# Patient Record
Sex: Female | Born: 1990 | Race: Black or African American | Hispanic: No | Marital: Single | Smoking: Never smoker
Health system: Southern US, Community
[De-identification: ages and names within clinical notes are randomized; demographics above are authoritative.]

## PROBLEM LIST (undated history)

## (undated) ENCOUNTER — Other Ambulatory Visit

## (undated) ENCOUNTER — Encounter

## (undated) ENCOUNTER — Telehealth

---

## 2016-04-15 DIAGNOSIS — N898 Other specified noninflammatory disorders of vagina: Secondary | ICD-10-CM

## 2016-04-15 DIAGNOSIS — R112 Nausea with vomiting, unspecified: Secondary | ICD-10-CM

## 2016-04-15 DIAGNOSIS — Z7721 Contact with and (suspected) exposure to potentially hazardous body fluids: Secondary | ICD-10-CM

## 2016-04-15 DIAGNOSIS — M549 Dorsalgia, unspecified: Secondary | ICD-10-CM

## 2016-04-15 DIAGNOSIS — R109 Unspecified abdominal pain: Principal | ICD-10-CM

## 2016-04-15 DIAGNOSIS — O98219 Gonorrhea complicating pregnancy, unspecified trimester: Secondary | ICD-10-CM

## 2016-04-15 DIAGNOSIS — Z206 Contact with and (suspected) exposure to human immunodeficiency virus [HIV]: Secondary | ICD-10-CM

## 2016-04-15 DIAGNOSIS — R197 Diarrhea, unspecified: Secondary | ICD-10-CM

## 2016-04-15 DIAGNOSIS — J45909 Unspecified asthma, uncomplicated: Principal | ICD-10-CM

## 2016-04-15 DIAGNOSIS — R102 Pelvic and perineal pain: Secondary | ICD-10-CM

## 2016-04-15 MED ORDER — AZITHROMYCIN 250 MG PO TABS
1000 mg | Freq: Once | ORAL | Status: CP
Start: 2016-04-15 — End: ?

## 2016-04-15 MED ORDER — CEFTRIAXONE SODIUM 250 MG IJ SOLR
250 mg | Freq: Once | INTRAMUSCULAR | Status: CP
Start: 2016-04-15 — End: ?

## 2016-04-15 MED ORDER — DOXYCYCLINE HYCLATE 100 MG PO CAPS
100 mg | Freq: Two times a day (BID) | ORAL | 0 refills | Status: CP
Start: 2016-04-15 — End: 2017-03-11

## 2016-04-16 ENCOUNTER — Emergency Department: Admit: 2016-04-16 | Discharge: 2016-04-16

## 2016-04-16 ENCOUNTER — Inpatient Hospital Stay: Admit: 2016-04-16 | Discharge: 2016-04-16

## 2016-04-16 NOTE — ED Notes
Time of discharge: 0053  AM., Patient discharged to  Home.  Patient discharged  ambulatory. to exit with belongings in  Stable condition.  Patient escorted by  no one., Written discharge instructions given to  patient.  Patient/recipient  verbalizes discharge instructions.

## 2016-04-16 NOTE — ED Provider Notes
ED Course   Discussed results and treatment for possible STD/PID. Follow up with PCP and GYN return if any new or worse symptoms.       ED Disposition   ED Disposition: Discharge      ED Clinical Impression   ED Clinical Impression:   Vaginal discharge  Pelvic pain      ED Patient Status   Patient Status:   Good        ED Medical Evaluation Initiated   Medical Evaluation Initiated:   Yes, filed at 04/15/16 2014  by Stefano GaulNicoloso, Francesca, PA-C

## 2016-04-16 NOTE — ED Provider Notes
Constitutional: She is oriented to person, place, and time. She appears well-developed and well-nourished. No distress.   HENT:   Head: Normocephalic and atraumatic.   Nose: Nose normal.   Mouth/Throat: Oropharynx is clear and moist.   Eyes: Conjunctivae are normal. Pupils are equal, round, and reactive to light.   Neck: Neck supple. No tracheal deviation present.   Cardiovascular: Normal rate and regular rhythm.    Pulmonary/Chest: Effort normal and breath sounds normal. No stridor. No respiratory distress. She has no wheezes.   Abdominal: Soft. Normal appearance. There is tenderness in the suprapubic area. There is no rebound and no guarding.   Genitourinary: There is no rash or tenderness on the right labia. There is no rash or tenderness on the left labia. Cervix exhibits motion tenderness and discharge. Right adnexum displays tenderness. Right adnexum displays no mass. Left adnexum displays no mass and no tenderness. No erythema, tenderness or bleeding in the vagina. No foreign body in the vagina. Vaginal discharge found.   Genitourinary Comments: Thick white discharge with foul odor   Musculoskeletal: She exhibits no edema.   Neurological: She is alert and oriented to person, place, and time. She exhibits normal muscle tone. Coordination normal.   Skin: Skin is warm and dry. No rash noted. She is not diaphoretic.   Psychiatric: She has a normal mood and affect. Her behavior is normal. Judgment and thought content normal.   Nursing note and vitals reviewed.      Differential DDx: STD, UTI, pregnancy, other    Is this an Emergent Medical Condition? Yes - Severe Pain/Acute Onset of Symptons  409.901 FS  641.19 FS  627.732 (16) FS    ED Workup   Procedures    Labs:  - - No data to display      Imaging (Read by ED Provider):  {Imaging [phone]}    EKG (Read by ED Provider):  not applicable    MDM    ED Course & Re-Evaluation     ED Course         ED Disposition   ED Disposition: No ED Disposition Set

## 2016-04-16 NOTE — ED Provider Notes
?   ECTOPIC PREGNANCY (ABDOMINAL) N/A 10/11/2013    ECTOPIC PREGNANCY (ABDOMINAL) performed by Valinda HoarBurnett, Erin Hale, MD at [facility]   ? LAPAROSCOPY ECTOPIC PREGNANCY N/A 10/11/2013    LAPAROSCOPY ECTOPIC PREGNANCY performed by Valinda HoarBurnett, Erin Hale, MD at [facility]       Family History   Problem Relation Age of Onset   ? Diabetes Other    ? High Blood Pressure Other    ? Diabetes Maternal Grandmother        Social History     Social History   ? Marital status: Single     Spouse name: N/A   ? Number of children: N/A   ? Years of education: N/A     Social History Main Topics   ? Smoking status: Never Smoker   ? Smokeless tobacco: Never Used   ? Alcohol use Yes      Comment: social   ? Drug use: No   ? Sexual activity: Yes     Partners: Male     Other Topics Concern   ? None     Social History Narrative       Review of Systems   Constitutional: Negative for fever, chills and fatigue.   Respiratory: Negative for chest tightness, shortness of breath and wheezing.    Gastrointestinal: Positive for nausea and abdominal pain. Negative for vomiting and blood in stool.   Genitourinary: Positive for vaginal discharge and pelvic pain. Negative for bladder incontinence, dysuria, hesitancy, hematuria, vaginal bleeding and dyspareunia.   Musculoskeletal: Positive for back pain.   Skin: Negative for color change, pallor, rash and wound.   Neurological: Negative for weakness.   All other systems reviewed and are negative.      Physical Exam     ED Triage Vitals   BP 04/15/16 2007 108/79   Pulse 04/15/16 2007 106   Resp 04/15/16 2007 18   Temp 04/15/16 2007 36.8 ?C (98.3 ?F)   Temp src 04/15/16 2007 Oral   Height 04/15/16 2007 1.615 m   Weight 04/15/16 2007 95.3 kg   SpO2 04/15/16 2007 99 %   BMI (Calculated) 04/15/16 2007 36.58       BP 108/79 - Pulse 106 - Temp 36.8 ?C (98.3 ?F) (Oral)  - Resp 18 - Ht 1.615 m - Wt 95.3 kg - SpO2 99% - BMI 36.5 kg/m2    Physical Exam

## 2016-04-16 NOTE — ED Provider Notes
Constitutional: She is oriented to person, place, and time. She appears well-developed and well-nourished. No distress.   HENT:   Head: Normocephalic and atraumatic.   Nose: Nose normal.   Mouth/Throat: Oropharynx is clear and moist.   Eyes: Conjunctivae are normal. Pupils are equal, round, and reactive to light.   Neck: Neck supple. No tracheal deviation present.   Cardiovascular: Normal rate and regular rhythm.    Pulmonary/Chest: Effort normal and breath sounds normal. No stridor. No respiratory distress. She has no wheezes.   Abdominal: Soft. Normal appearance. There is tenderness in the suprapubic area. There is no rebound and no guarding.   Musculoskeletal: She exhibits no edema.   Neurological: She is alert and oriented to person, place, and time. She exhibits normal muscle tone. Coordination normal.   Skin: Skin is warm and dry. No rash noted. She is not diaphoretic.   Psychiatric: She has a normal mood and affect. Her behavior is normal. Judgment and thought content normal.   Nursing note and vitals reviewed.      Differential DDx: STD, UTI, pregnancy, other    Is this an Emergent Medical Condition? Yes - Severe Pain/Acute Onset of Symptons  409.901 FS  641.19 FS  627.732 (16) FS    ED Workup   Procedures    Labs:  - - No data to display      Imaging (Read by ED Provider):  {Imaging findings:[phone]}    EKG (Read by ED Provider):  not applicable    MDM    ED Course & Re-Evaluation     ED Course         ED Disposition   ED Disposition: No ED Disposition Set      ED Clinical Impression   ED Clinical Impression:   No Clinical Impression Set      ED Patient Status   Patient Status:   Good        ED Medical Evaluation Initiated   Medical Evaluation Initiated:   Yes, filed at 04/15/16 2014  by Stefano GaulNicoloso, Francesca, PA-C

## 2016-04-16 NOTE — ED Provider Notes
History     Chief Complaint   Patient presents with   ? Vaginal Discharge   ? Back Pain   ? Diarrhea   ? Emesis   ? Abdominal Pain       Patient is a 25 y.o. female presenting with vaginal discharge. The history is provided by the patient. No language interpreter was used.   Vaginal Discharge   Quality:  White  Severity:  Moderate  Onset quality:  Gradual  Duration:  3 days  Timing:  Constant  Progression:  Worsening  Chronicity:  New  Relieved by:  Nothing  Associated symptoms: abdominal pain and nausea    Associated symptoms: no dyspareunia, no dysuria, no fever, no urinary frequency, no urinary hesitancy, no urinary incontinence, no vaginal itching and no vomiting    Risk factors: unprotected sex        Allergies   Allergen Reactions   ? No Known Allergies      No Known Allergies       Patient's Medications   New Prescriptions    DOXYCYCLINE (VIBRAMYCIN) 100 MG CAPSULE    Take 1 capsule by mouth 2 times daily for 10 days.   Previous Medications    ALBUTEROL (PROAIR HFA;VENTOLIN HFA) 108 (90 BASE) MCG/ACT AEROSOL SOLUTION    Inhale 2 puffs every 6 hours as needed for wheezing (Dispense with spacer and teach how to use).    DICYCLOMINE (BENTYL) 20 MG TABLET    Take 1 tablet by mouth 3 times daily as needed.    HYDROCODONE-ACETAMINOPHEN (NORCO) 5-325 MG TABLET    Take 1 tablet by mouth every 6 hours as needed for pain.    IBUPROFEN (ADVIL,MOTRIN) 800 MG TABLET    Take 1 tablet by mouth every 6 hours as needed for pain.    ONDANSETRON (ZOFRAN-ODT) 4 MG TABLET DISPERSIBLE    Dissolve 1 tablet in mouth every 8 hours as needed for nausea or vomiting.   Modified Medications    No medications on file   Discontinued Medications    No medications on file       Past Medical History:   Diagnosis Date   ? Asthma    ? Chlamydia infection complicating pregnancy     03/20/11   ? Gonorrhea complicating pregnancy     03/20/11   ? HIV exposure     per pt mother and father deceased from AIDS       Past Surgical History:

## 2016-04-16 NOTE — ED Provider Notes
History     Chief Complaint   Patient presents with   ? Vaginal Discharge   ? Back Pain   ? Diarrhea   ? Emesis   ? Abdominal Pain       Patient is a 25 y.o. female presenting with vaginal discharge. The history is provided by the patient. No language interpreter was used.   Vaginal Discharge   Quality:  White  Severity:  Moderate  Onset quality:  Gradual  Duration:  3 days  Timing:  Constant  Progression:  Worsening  Chronicity:  New  Relieved by:  Nothing  Associated symptoms: abdominal pain and nausea    Associated symptoms: no dyspareunia, no dysuria, no fever, no urinary frequency, no urinary hesitancy, no urinary incontinence, no vaginal itching and no vomiting    Risk factors: unprotected sex        Allergies   Allergen Reactions   ? No Known Allergies      No Known Allergies       Patient's Medications   New Prescriptions    No medications on file   Previous Medications    ALBUTEROL (PROAIR HFA;VENTOLIN HFA) 108 (90 BASE) MCG/ACT AEROSOL SOLUTION    Inhale 2 puffs every 6 hours as needed for wheezing (Dispense with spacer and teach how to use).    DICYCLOMINE (BENTYL) 20 MG TABLET    Take 1 tablet by mouth 3 times daily as needed.    HYDROCODONE-ACETAMINOPHEN (NORCO) 5-325 MG TABLET    Take 1 tablet by mouth every 6 hours as needed for pain.    IBUPROFEN (ADVIL,MOTRIN) 800 MG TABLET    Take 1 tablet by mouth every 6 hours as needed for pain.    ONDANSETRON (ZOFRAN-ODT) 4 MG TABLET DISPERSIBLE    Dissolve 1 tablet in mouth every 8 hours as needed for nausea or vomiting.   Modified Medications    No medications on file   Discontinued Medications    No medications on file       Past Medical History:   Diagnosis Date   ? Asthma    ? Chlamydia infection complicating pregnancy     03/20/11   ? Gonorrhea complicating pregnancy     03/20/11   ? HIV exposure     per pt mother and father deceased from AIDS       Past Surgical History:   Procedure Laterality Date   ? CESAREAN SECTION

## 2016-04-16 NOTE — ED Provider Notes
Procedure Laterality Date   ? CESAREAN SECTION     ? ECTOPIC PREGNANCY (ABDOMINAL) N/A 10/11/2013    ECTOPIC PREGNANCY (ABDOMINAL) performed by Valinda HoarBurnett, Erin Hale, MD at [facility]   ? LAPAROSCOPY ECTOPIC PREGNANCY N/A 10/11/2013    LAPAROSCOPY ECTOPIC PREGNANCY performed by Valinda HoarBurnett, Erin Hale, MD at [facility]       Family History   Problem Relation Age of Onset   ? Diabetes Other    ? High Blood Pressure Other    ? Diabetes Maternal Grandmother        Social History     Social History   ? Marital status: Single     Spouse name: N/A   ? Number of children: N/A   ? Years of education: N/A     Social History Main Topics   ? Smoking status: Never Smoker   ? Smokeless tobacco: Never Used   ? Alcohol use Yes      Comment: social   ? Drug use: No   ? Sexual activity: Yes     Partners: Male     Other Topics Concern   ? None     Social History Narrative       Review of Systems   Constitutional: Negative for fever, chills and fatigue.   Respiratory: Negative for chest tightness, shortness of breath and wheezing.    Gastrointestinal: Positive for nausea and abdominal pain. Negative for vomiting and blood in stool.   Genitourinary: Positive for vaginal discharge and pelvic pain. Negative for bladder incontinence, dysuria, hesitancy, hematuria, vaginal bleeding and dyspareunia.   Musculoskeletal: Positive for back pain.   Skin: Negative for color change, pallor, rash and wound.   Neurological: Negative for weakness.   All other systems reviewed and are negative.      Physical Exam       ED Triage Vitals   BP 04/15/16 2007 108/79   Pulse 04/15/16 2007 106   Resp 04/15/16 2007 18   Temp 04/15/16 2007 36.8 ?C (98.3 ?F)   Temp src 04/15/16 2007 Oral   Height 04/15/16 2007 1.615 m   Weight 04/15/16 2007 95.3 kg   SpO2 04/15/16 2007 99 %   BMI (Calculated) 04/15/16 2007 36.58       BP 108/79 - Pulse 106 - Temp 36.8 ?C (98.3 ?F) (Oral)  - Resp 18 - Ht 1.615 m - Wt 95.3 kg - SpO2 99% - BMI 36.5 kg/m2    Physical Exam

## 2016-04-16 NOTE — ED Notes
Pt ambulates to triage room 2 without complications . Pt is aaox4. Pts c/c " abd pain with N/V/D and vaginal discharge ". Pt is eating chips during triage . Pts states this has been going on since last night . Pt denies any dysuria . Pt denies any complications with bowel movements or blood in urine or stool. Pt denies any injuries. Pt denies any new medications or food . Pts resp are even  And non labored. Pts lung are clear bil. Pt has rounded abd with+ bowel sounds noted. Pt has no tenderness noted. Pts VSS. Pt to [facility] until bed available . Pt denies any vaginal bleeding or odor . Pt reports vaginal discharge of white color .

## 2016-04-16 NOTE — ED Provider Notes
ED Clinical Impression   ED Clinical Impression:   No Clinical Impression Set      ED Patient Status   Patient Status:   Good        ED Medical Evaluation Initiated   Medical Evaluation Initiated:   Yes, filed at 04/15/16 2014  by Stefano GaulNicoloso, Francesca, PA-C

## 2016-04-16 NOTE — ED Provider Notes
ED Course   Discussed results and treatment (given today) for possible STD/PID. Follow up with PCP and GYN return if any new or worse symptoms.       ED Disposition   ED Disposition: Discharge      ED Clinical Impression   ED Clinical Impression:   Vaginal discharge  Pelvic pain      ED Patient Status   Patient Status:   Good        ED Medical Evaluation Initiated   Medical Evaluation Initiated:   Yes, filed at 04/15/16 2014  by Stefano GaulNicoloso, Francesca, PA-C          The patient was seen exclusively by the PA/ARNP, and was not seen by me. I was immediately available in the Emergency Department for consultation as needed.         Rogelia RohrerStorch, Ian David, MD  04/16/16 [id number]

## 2016-04-16 NOTE — ED Provider Notes
Constitutional: She is oriented to person, place, and time. She appears well-developed and well-nourished. No distress.   HENT:   Head: Normocephalic and atraumatic.   Nose: Nose normal.   Mouth/Throat: Oropharynx is clear and moist.   Eyes: Conjunctivae are normal. Pupils are equal, round, and reactive to light.   Neck: Neck supple. No tracheal deviation present.   Cardiovascular: Normal rate and regular rhythm.    Pulmonary/Chest: Effort normal and breath sounds normal. No stridor. No respiratory distress. She has no wheezes.   Abdominal: Soft. Normal appearance. There is tenderness in the suprapubic area. There is no rebound and no guarding.   Genitourinary: There is no rash or tenderness on the right labia. There is no rash or tenderness on the left labia. Cervix exhibits motion tenderness and discharge. Right adnexum displays tenderness. Right adnexum displays no mass. Left adnexum displays no mass and no tenderness. No erythema, tenderness or bleeding in the vagina. No foreign body in the vagina. Vaginal discharge found.   Genitourinary Comments: Thick white discharge with foul odor   Musculoskeletal: She exhibits no edema.   Neurological: She is alert and oriented to person, place, and time. She exhibits normal muscle tone. Coordination normal.   Skin: Skin is warm and dry. No rash noted. She is not diaphoretic.   Psychiatric: She has a normal mood and affect. Her behavior is normal. Judgment and thought content normal.   Nursing note and vitals reviewed.      Differential DDx: STD, UTI, pregnancy, other    Is this an Emergent Medical Condition? Yes - Severe Pain/Acute Onset of Symptons  409.901 FS  641.19 FS  627.732 (16) FS    ED Workup   Procedures    Labs:  -   URINALYSIS W/MICROSCOPY - Abnormal        Result Value Ref Range    Specific Gravity, Urine 1.031 (*) 1.003 - 1.030    Ketones UA 20.0 (*) Negative mg/dL    Bacteria -Ur Rare (*) None seen /HPF    Color -Ur Yellow  Amber    Clarity, UA Hazy  Hazy

## 2016-04-16 NOTE — ED Provider Notes
pH, Urine 5.0  4.5 - 8.0    Protein-UA Negative  Negative mg/dL    Glucose -Ur Negative  Negative mg/dL    Bilirubin -Ur Negative  Negative    Blood -Ur Negative  Negative    Nitrite -Ur Negative  Negative    Urobilinogen -Ur Normal  Normal    Leukocytes -Ur Negative  Negative    RBC -Ur 1  0 - 5 /HPF    WBC -Ur 1  0 - 5 /HPF    Squam Epithel, UA 3  Not established /HPF    Mucus -Ur Rare  2+ /LPF    ASCORBIC ACID 40.0  20 mg/dL   WET PREP - Normal    Trichomonas Vaginalis Negative for Trichomonas   Negative for Trichomonas     YEAST Negative for Yeast  Negative for Yeast   HCG, QUALITATIVE, URINE - Normal    HCG-Urine Negative  Negative   CHLAMYDIA/ GONORRHEA DETECTIO*         Imaging (Read by ED Provider):  Per Radiology: [place] Pelvic Transabdominal & Transvaginal W Doppler    Result Date: 04/15/2016  Procedure:  US PELVIC TRANSABDOMINAL & TRANSVAGINAL W DOPPLER Ordering History: 25 years Female  Pelvic pain Additional History: None Comparison: Jan 14, 2015. Technique: Transabdominal pelvic ultrasound was initially performed with limited visualization of the endometrium and/or adnexal structures.  Therefore, transvaginal pelvic ultrasound was performed for better evaluation. Findings: Uterus: 8.7 x 3.2 x 4.3 cm measured transabdominally only, homogeneous in echogenicity. Endometrium: 1 mm, normal. Cervix: The cervix is unremarkable. Right Adnexa: The right ovary measures 3.2 x 2.4 x 2.7 cm, normal in echogenicity. Doppler interrogation demonstrates normal arterial and venous waveforms. Left Adnexa: The left ovary measures transabdominally 2.7 x 1.6 x 2.4 cm, normal in echogenicity.  Doppler interrogation demonstrates normal arterial and venous waveforms. Free fluid: No pelvic free fluid seen.     Impression: Unremarkable ultrasound of the pelvis.  No evidence of an adnexal mass.   No evidence to suggest ovarian torsion.       EKG (Read by ED Provider):  not applicable    MDM    ED Course & Re-Evaluation

## 2016-12-10 ENCOUNTER — Inpatient Hospital Stay: Admit: 2016-12-10 | Discharge: 2016-12-10

## 2016-12-10 ENCOUNTER — Emergency Department: Admit: 2016-12-10 | Discharge: 2016-12-10

## 2016-12-10 DIAGNOSIS — N83201 Unspecified ovarian cyst, right side: Secondary | ICD-10-CM

## 2016-12-10 DIAGNOSIS — N898 Other specified noninflammatory disorders of vagina: Secondary | ICD-10-CM

## 2016-12-10 DIAGNOSIS — O98219 Gonorrhea complicating pregnancy, unspecified trimester: Secondary | ICD-10-CM

## 2016-12-10 DIAGNOSIS — J45909 Unspecified asthma, uncomplicated: Principal | ICD-10-CM

## 2016-12-10 DIAGNOSIS — R062 Wheezing: Secondary | ICD-10-CM

## 2016-12-10 DIAGNOSIS — R0789 Other chest pain: Secondary | ICD-10-CM

## 2016-12-10 DIAGNOSIS — R102 Pelvic and perineal pain: Principal | ICD-10-CM

## 2016-12-10 DIAGNOSIS — J45901 Unspecified asthma with (acute) exacerbation: Secondary | ICD-10-CM

## 2016-12-10 DIAGNOSIS — Z206 Contact with and (suspected) exposure to human immunodeficiency virus [HIV]: Secondary | ICD-10-CM

## 2016-12-10 DIAGNOSIS — R110 Nausea: Secondary | ICD-10-CM

## 2016-12-10 DIAGNOSIS — R079 Chest pain, unspecified: Secondary | ICD-10-CM

## 2016-12-10 MED ORDER — TRAMADOL HCL 50 MG PO TABS
0 refills | Status: CP
Start: 2016-12-10 — End: 2017-01-10

## 2016-12-10 MED ORDER — IBUPROFEN 800 MG PO TABS
800 mg | Freq: Three times a day (TID) | ORAL | 0 refills | Status: CP | PRN
Start: 2016-12-10 — End: 2017-01-10

## 2016-12-10 MED ORDER — IPRATROPIUM-ALBUTEROL 0.5-2.5 (3) MG/3ML IN SOLN
3 mL | Freq: Once | RESPIRATORY_TRACT | Status: CP
Start: 2016-12-10 — End: ?

## 2016-12-10 MED ORDER — IPRATROPIUM BROMIDE 0.02 % IN SOLN
.5 mg | Freq: Once | RESPIRATORY_TRACT | Status: DC
Start: 2016-12-10 — End: 2016-12-11

## 2016-12-10 NOTE — ED Triage Notes
Pt c/o lower bilateral abd pain for 1 week, and woke up this morning with chest tightness, states has hx of asthma, and ectopic preg. Denied fever yet admits to n/v,. Denied diarrhea, vaginal bleeding or possible pregnancy at this time. A & o x4, resp are equal and non labored, skin w/d, NAD, PERRL, GCS = 15, cm applied and steady gait noted to treatment room,awaiting ED MD eval

## 2016-12-10 NOTE — ED Notes
RTa t bs to given breathing TX.

## 2016-12-10 NOTE — ED Notes
Pt states she feels much better after breatig tx in her chest, aware of awaiting u/s results

## 2016-12-10 NOTE — ED Notes
IV removed with catheter intact.  Pressure applied, Time of discharge: 1708  PM., Patient discharged to  Home.  Patient discharged  ambulatory. to exit with belongings in  Improved condition.  Patient escorted by  no one., Written discharge instructions given to  patient.  Patient/recipient  verbalizes discharge instructions.

## 2016-12-10 NOTE — ED Notes
Taken to u/s via w/c

## 2016-12-10 NOTE — ED Triage Notes
Pt c/o lower bilateral abd pain for 1 week, adn woke up this morning with chest tightness, states has hx of asthma, and ectopic preg. Denied fever yet admits to n/v,. Denied diarrhea, vagianl bleeding or possible preg at this time. A & o x4, resp are equal and non labored, skin w/d, NAD, PERRL, GCS = 15, cm applied and steady gait noted to treatment room,awaiting ED MD eval

## 2016-12-16 NOTE — ED Provider Notes
Independent Visualization (ED [place], Wet Prep, Other): Yes - Documented in ED Provider Note    Discussed patient with NON-ED Provider: None      ED Disposition   ED Disposition: Discharge      ED Clinical Impression   ED Clinical Impression:   Chest pain  Chest tightness  Cyst of right ovary  Pelvic pain      ED Patient Status   Patient Status:   Good        ED Medical Evaluation Initiated   Medical Evaluation Initiated:   Yes, filed at 12/10/16 1326  by Sherri Sear, MD             Sherri Sear, MD  12/10/16 1326      Scribe Attestation: Paulla Fore, have acted as a Neurosurgeon for Sherri Sear, MD 1:27 PM 12/10/2016     Physician Attestation: Si Raider, MD  12/10/16 [phone]

## 2016-12-16 NOTE — ED Provider Notes
History   No chief complaint on file.      HPI    Allergies   Allergen Reactions   ? No Known Allergies      No Known Allergies       Patient's Medications   New Prescriptions    No medications on file   Previous Medications    ALBUTEROL (PROAIR HFA;VENTOLIN HFA) 108 (90 BASE) MCG/ACT AEROSOL SOLUTION    Inhale 2 puffs every 6 hours as needed for wheezing (Dispense with spacer and teach how to use).    DICYCLOMINE (BENTYL) 20 MG TABLET    Take 1 tablet by mouth 3 times daily as needed.    HYDROCODONE-ACETAMINOPHEN (NORCO) 5-325 MG TABLET    Take 1 tablet by mouth every 6 hours as needed for pain.    IBUPROFEN (ADVIL,MOTRIN) 800 MG TABLET    Take 1 tablet by mouth every 6 hours as needed for pain.    ONDANSETRON (ZOFRAN-ODT) 4 MG TABLET DISPERSIBLE    Dissolve 1 tablet in mouth every 8 hours as needed for nausea or vomiting.   Modified Medications    No medications on file   Discontinued Medications    No medications on file       Past Medical History:   Diagnosis Date   ? Asthma    ? Chlamydia infection complicating pregnancy     03/20/11   ? Gonorrhea complicating pregnancy     03/20/11   ? HIV exposure     per pt mother and father deceased from AIDS       Past Surgical History:   Procedure Laterality Date   ? CESAREAN SECTION     ? ECTOPIC PREGNANCY (ABDOMINAL) N/A 10/11/2013    ECTOPIC PREGNANCY (ABDOMINAL) performed by Valinda Hoar, MD at [facility]   ? LAPAROSCOPY ECTOPIC PREGNANCY N/A 10/11/2013    LAPAROSCOPY ECTOPIC PREGNANCY performed by Valinda Hoar, MD at [facility]       Family History   Problem Relation Age of Onset   ? Diabetes Other    ? High Blood Pressure Other    ? Diabetes Maternal Grandmother        Social History     Social History   ? Marital status: Single     Spouse name: N/A   ? Number of children: N/A   ? Years of education: N/A     Social History Main Topics   ? Smoking status: Never Smoker   ? Smokeless tobacco: Never Used   ? Alcohol use Yes      Comment: social

## 2016-12-16 NOTE — ED Provider Notes
Social History Narrative       Review of Systems   Constitutional: Negative for fever and chills.   HENT: Negative for nasal congestion and nasal discharge.    Eyes: Negative for photophobia and pain.   Respiratory: Positive for wheezing. Negative for cough and shortness of breath.    Cardiovascular: Negative for chest pain and leg swelling.   Gastrointestinal: Positive for nausea and abdominal pain. Negative for vomiting and diarrhea.   Genitourinary: Negative for dysuria and difficulty urinating.   Musculoskeletal: Negative for back pain and gait problem.   Skin: Negative for rash and wound.   Neurological: Negative for light-headedness and headaches.   All other systems reviewed and are negative.      Physical Exam       ED Triage Vitals   BP 12/10/16 1334 116/86   Pulse 12/10/16 1334 74   Resp 12/10/16 1334 18   Temp 12/10/16 1334 36.7 ?C (98 ?F)   Temp src 12/10/16 1334 Oral   Height 12/10/16 1334 1.6 m   Weight 12/10/16 1334 99.8 kg   SpO2 12/10/16 1334 100 %   BMI (Calculated) 12/10/16 1334 39.05             Physical Exam   Constitutional: She is oriented to person, place, and time. She appears well-developed and well-nourished. No distress.   HENT:   Head: Normocephalic and atraumatic.   Right Ear: External ear normal.   Left Ear: External ear normal.   Eyes: Conjunctivae are normal.   Cardiovascular: Normal rate, regular rhythm, normal heart sounds and intact distal pulses.    Pulmonary/Chest: Effort normal and breath sounds normal. No respiratory distress.   Abdominal: Soft. There is tenderness in the right lower quadrant, suprapubic area, left upper quadrant and left lower quadrant. There is no rigidity, no rebound and no guarding.   Genitourinary: Cervix exhibits no motion tenderness. Right adnexum displays tenderness (mild). Left adnexum displays tenderness (mild). Vaginal discharge (white) found.   Genitourinary Comments: Mild tenderness to uterus. Os closed.

## 2016-12-16 NOTE — ED Provider Notes
History     Chief Complaint   Patient presents with   ? Abdominal Pain   ? Other     chest tightness       HPI Comments: 26 y.o. female with h/o asthma presents with c/o abd pain x1 week. Pt states the pain is localized to her lower abd. She is sexually active with one partner and does not use protection. Reports nausea but denies fever, emesis, diarrhea, vaginal bleeding, or vaginal d/c. She also noticed wheezing this AM but ran out of her inhaler. Shx C-section and ectopic pregnancy rupture.    Patient is a 26 y.o. female presenting with abdominal pain. The history is provided by the patient. No language interpreter was used.   Abdominal Pain   Pain location:  Suprapubic  Pain radiates to:  Does not radiate  Pain severity:  Moderate  Onset quality:  Gradual  Duration:  1 week  Timing:  Constant  Progression:  Unchanged  Chronicity:  New  Relieved by:  Nothing  Worsened by:  Nothing  Ineffective treatments:  None tried  Associated symptoms: nausea    Associated symptoms: no chest pain, no chills, no cough, no diarrhea, no dysuria, no fever, no shortness of breath and no vomiting        Allergies   Allergen Reactions   ? No Known Allergies      No Known Allergies       Patient's Medications   New Prescriptions    IBUPROFEN (ADVIL,MOTRIN) 800 MG PO TABLET    Take 1 tablet by mouth every 8 hours as needed for pain.    TRAMADOL (ULTRAM) 50 MG PO TABLET    1-2 tabs every 6 hours prn pain   Previous Medications    ALBUTEROL (PROAIR HFA;VENTOLIN HFA) 108 (90 BASE) MCG/ACT AEROSOL SOLUTION    Inhale 2 puffs every 6 hours as needed for wheezing (Dispense with spacer and teach how to use).    ONDANSETRON (ZOFRAN-ODT) 4 MG TABLET DISPERSIBLE    Dissolve 1 tablet in mouth every 8 hours as needed for nausea or vomiting.   Modified Medications    No medications on file   Discontinued Medications    DICYCLOMINE (BENTYL) 20 MG TABLET    Take 1 tablet by mouth 3 times daily as needed.

## 2016-12-16 NOTE — ED Provider Notes
HYDROCODONE-ACETAMINOPHEN (NORCO) 5-325 MG TABLET    Take 1 tablet by mouth every 6 hours as needed for pain.    IBUPROFEN (ADVIL,MOTRIN) 600 MG TABLET    Take 1 Tablet by mouth every 6 hours as needed for Pain for 10 days.    IBUPROFEN (ADVIL,MOTRIN) 800 MG TABLET    Take 1 Tablet by mouth every 6 hours as needed for Pain for 10 days.    IBUPROFEN (ADVIL,MOTRIN) 800 MG TABLET    Take 1 Tablet by mouth every 6 hours for 10 days.    IBUPROFEN (ADVIL,MOTRIN) 800 MG TABLET    Take 1 Tablet by mouth every 6 hours as needed for pain.    IBUPROFEN (ADVIL,MOTRIN) 800 MG TABLET    Take 1 Tablet by mouth every 6 hours as needed for pain.    IBUPROFEN (ADVIL,MOTRIN) 800 MG TABLET    Take 1 Tablet by mouth every 6 hours as needed for pain.    IBUPROFEN (ADVIL,MOTRIN) 800 MG TABLET    Take 1 tablet by mouth every 6 hours as needed for pain.       Past Medical History:   Diagnosis Date   ? Asthma    ? Chlamydia infection complicating pregnancy     03/20/11   ? Gonorrhea complicating pregnancy     03/20/11   ? HIV exposure     per pt mother and father deceased from AIDS       Past Surgical History:   Procedure Laterality Date   ? CESAREAN SECTION     ? ECTOPIC PREGNANCY (ABDOMINAL) N/A 10/11/2013    ECTOPIC PREGNANCY (ABDOMINAL) performed by Valinda Hoar, MD at [facility]   ? LAPAROSCOPY ECTOPIC PREGNANCY N/A 10/11/2013    LAPAROSCOPY ECTOPIC PREGNANCY performed by Valinda Hoar, MD at [facility]       Family History   Problem Relation Age of Onset   ? Diabetes Other    ? High Blood Pressure Other    ? Diabetes Maternal Grandmother        Social History     Social History   ? Marital status: Single     Spouse name: N/A   ? Number of children: N/A   ? Years of education: N/A     Social History Main Topics   ? Smoking status: Never Smoker   ? Smokeless tobacco: Never Used   ? Alcohol use Yes      Comment: social   ? Drug use: No   ? Sexual activity: Yes     Partners: Male     Other Topics Concern   ? None

## 2016-12-16 NOTE — ED Provider Notes
Diagnostic Tests (Radiology, EKG): Ordered and Reviewed    Independent Visualization (ED [place], Wet Prep, Other): Yes - Documented in ED Provider Note    Discussed patient with NON-ED Provider: None      ED Disposition   ED Disposition: No ED Disposition Set      ED Clinical Impression   ED Clinical Impression:   Other chest pain  Chest pain  SOB (shortness of breath)      ED Patient Status   Patient Status:   Good        ED Medical Evaluation Initiated   Medical Evaluation Initiated:   Yes, filed at 12/10/16 1326  by Sherri Sear, MD             Sherri Sear, MD  12/10/16 1326      Scribe Attestation: Paulla Fore, have acted as a Neurosurgeon for Sherri Sear, MD 1:27 PM 12/10/2016     Physician Attestation: Marland Kitchen

## 2016-12-16 NOTE — ED Provider Notes
per pt mother and father deceased from AIDS       Past Surgical History:   Procedure Laterality Date   ? CESAREAN SECTION     ? ECTOPIC PREGNANCY (ABDOMINAL) N/A 10/11/2013    ECTOPIC PREGNANCY (ABDOMINAL) performed by Valinda Hoar, MD at [facility]   ? LAPAROSCOPY ECTOPIC PREGNANCY N/A 10/11/2013    LAPAROSCOPY ECTOPIC PREGNANCY performed by Valinda Hoar, MD at [facility]       Family History   Problem Relation Age of Onset   ? Diabetes Other    ? High Blood Pressure Other    ? Diabetes Maternal Grandmother        Social History     Social History   ? Marital status: Single     Spouse name: N/A   ? Number of children: N/A   ? Years of education: N/A     Social History Main Topics   ? Smoking status: Never Smoker   ? Smokeless tobacco: Never Used   ? Alcohol use Yes      Comment: social   ? Drug use: No   ? Sexual activity: Yes     Partners: Male     Other Topics Concern   ? None     Social History Narrative       Review of Systems   Constitutional: Negative for fever and chills.   HENT: Negative for nasal congestion and nasal discharge.    Eyes: Negative for photophobia and pain.   Respiratory: Negative for cough and shortness of breath.    Cardiovascular: Negative for chest pain and leg swelling.   Gastrointestinal: Positive for abdominal pain. Negative for nausea, vomiting and diarrhea.   Genitourinary: Negative for dysuria and difficulty urinating.   Musculoskeletal: Negative for back pain and gait problem.   Skin: Negative for rash and wound.   Neurological: Negative for light-headedness and headaches.   All other systems reviewed and are negative.      Physical Exam     ED Triage Vitals   BP --    Pulse --    Resp --    Temp --    Temp src --    Height --    Weight --    SpO2 --    BMI (Calculated) --              Physical Exam    Differential DDx: ***    Is this an Emergent Medical Condition? {SH ED EMERGENT MEDICAL CONDITION:[phone]}  409.901 FS  641.19 FS  627.732 (16) FS    ED Workup

## 2016-12-16 NOTE — ED Provider Notes
?   Drug use: No   ? Sexual activity: Yes     Partners: Male     Other Topics Concern   ? None     Social History Narrative       Review of Systems    Physical Exam     ED Triage Vitals   BP --    Pulse --    Resp --    Temp --    Temp src --    Height --    Weight --    SpO2 --    BMI (Calculated) --              Physical Exam    Differential DDx: ***    Is this an Emergent Medical Condition? {SH ED EMERGENT MEDICAL CONDITION:[phone]}  409.901 FS  641.19 FS  627.732 (16) FS    ED Workup   Procedures    Labs:  - - No data to display      Imaging (Read by ED Provider):  {Imaging [phone]}      EKG (Read by ED Provider):  {EKG findings:[phone]}        ED Course & Re-Evaluation     ED Course         MDM   Decide to obtain history from someone other than the patient: {SH ED Lamonte Sakai MDM - OBTAIN [id number]:01027}    Decide to obtain previous medical records: [facility] ED Lamonte Sakai MDM - PREVIOUS MED REC - NO OZD:66440}    Clinical Lab Test(s): {SH ED Lamonte Sakai MDM ORDERED AND REVIEWED:28124}    Diagnostic Tests (Radiology, EKG): {SH ED Lamonte Sakai MDM ORDERED AND REVIEWED:28124}    Independent Visualization (ED [place], Wet Prep, Other): {SH ED Lamonte Sakai MDM NO YES [id number]:63875}    Discussed patient with NON-ED Provider: {SH ED Lamonte Sakai MDM - ANOTHER PROVIDER:28381}      ED Disposition   ED Disposition: No ED Disposition Set      ED Clinical Impression   ED Clinical Impression:   Other chest pain      ED Patient Status   Patient Status:   {SH ED [facility] PATIENT [phone]}        ED Medical Evaluation Initiated   Medical Evaluation Initiated:   Yes, filed at 12/10/16 1326  by Sherri Sear, MD             Sherri Sear, MD  12/10/16 1326

## 2016-12-16 NOTE — ED Provider Notes
History   No chief complaint on file.      HPI Comments: 26 y.o. female presents with c/o abd pain x1 week. Pt states the pain is lower on bilat sides        Denies vaginal bleeding or vaginal d/c      Out of her inhaler        No fever NVD     Shx C-section and ectopic pregnancy rupture.    Patient is a 26 y.o. female presenting with abdominal pain. The history is provided by the patient. No language interpreter was used.   Abdominal Pain   Pain location:  Suprapubic  Pain radiates to:  Does not radiate  Pain severity:  Moderate  Onset quality:  Gradual  Duration:  1 week  Timing:  Constant  Progression:  Unchanged  Chronicity:  New  Relieved by:  Nothing  Worsened by:  Nothing  Ineffective treatments:  None tried  Associated symptoms: no chest pain, no chills, no cough, no diarrhea, no dysuria, no fever, no nausea, no shortness of breath and no vomiting        Allergies   Allergen Reactions   ? No Known Allergies      No Known Allergies       Patient's Medications   New Prescriptions    No medications on file   Previous Medications    ALBUTEROL (PROAIR HFA;VENTOLIN HFA) 108 (90 BASE) MCG/ACT AEROSOL SOLUTION    Inhale 2 puffs every 6 hours as needed for wheezing (Dispense with spacer and teach how to use).    DICYCLOMINE (BENTYL) 20 MG TABLET    Take 1 tablet by mouth 3 times daily as needed.    HYDROCODONE-ACETAMINOPHEN (NORCO) 5-325 MG TABLET    Take 1 tablet by mouth every 6 hours as needed for pain.    IBUPROFEN (ADVIL,MOTRIN) 800 MG TABLET    Take 1 tablet by mouth every 6 hours as needed for pain.    ONDANSETRON (ZOFRAN-ODT) 4 MG TABLET DISPERSIBLE    Dissolve 1 tablet in mouth every 8 hours as needed for nausea or vomiting.   Modified Medications    No medications on file   Discontinued Medications    No medications on file       Past Medical History:   Diagnosis Date   ? Asthma    ? Chlamydia infection complicating pregnancy     03/20/11   ? Gonorrhea complicating pregnancy     03/20/11   ? HIV exposure

## 2016-12-16 NOTE — ED Provider Notes
Independent Visualization (ED [place], Wet Prep, Other): Yes - Documented in ED Provider Note    Discussed patient with NON-ED Provider: None      ED Disposition   ED Disposition: Discharge      ED Clinical Impression   ED Clinical Impression:   Chest pain  Chest tightness  Cyst of right ovary  Pelvic pain      ED Patient Status   Patient Status:   Good        ED Medical Evaluation Initiated   Medical Evaluation Initiated:   Yes, filed at 12/10/16 1326  by Sherri Sear, MD             Sherri Sear, MD  12/10/16 1326      Scribe Attestation: Paulla Fore, have acted as a Neurosurgeon for Sherri Sear, MD 1:27 PM 12/10/2016     Physician Attestation: Brien Mates, MD. My portion of the note did not require a scribe.          Si Raider, MD  12/10/16 1654       Si Raider, MD  12/14/16 1719       Si Raider, MD  12/14/16 Leroy Libman, MD  12/15/16 1244       Si Raider, MD  12/16/16 1451

## 2016-12-16 NOTE — ED Provider Notes
Independent Visualization (ED [place], Wet Prep, Other): Yes - Documented in ED Provider Note    Discussed patient with NON-ED Provider: None      ED Disposition   ED Disposition: Discharge      ED Clinical Impression   ED Clinical Impression:   Chest pain  Chest tightness  Cyst of right ovary  Pelvic pain      ED Patient Status   Patient Status:   Good        ED Medical Evaluation Initiated   Medical Evaluation Initiated:   Yes, filed at 12/10/16 1326  by Sherri Sear, MD             Sherri Sear, MD  12/10/16 1326      Scribe Attestation: Paulla Fore, have acted as a Neurosurgeon for Sherri Sear, MD 1:27 PM 12/10/2016     Physician Attestation: Si Raider, MD  12/10/16 1654       Si Raider, MD  12/14/16 1719

## 2016-12-16 NOTE — ED Provider Notes
Procedures    Labs:  - - No data to display      Imaging (Read by ED Provider):  {Imaging [phone]}      EKG (Read by ED Provider):  {EKG [phone]}        ED Course & Re-Evaluation     ED Course   ***    HL nlno reb guar rig   RL LL suprapbic  LUQ      MDM   Decide to obtain history from someone other than the patient: {SH ED Lamonte Sakai MDM - OBTAIN ZOXWRUE:45409}    Decide to obtain previous medical records: [facility] ED Lamonte Sakai MDM - PREVIOUS MED REC - NO WJX:91478}    Clinical Lab Test(s): {SH ED Lamonte Sakai MDM ORDERED AND REVIEWED:28124}    Diagnostic Tests (Radiology, EKG): {SH ED Lamonte Sakai MDM ORDERED AND REVIEWED:28124}    Independent Visualization (ED [place], Wet Prep, Other): {SH ED Lamonte Sakai MDM NO YES [id number]:86578}    Discussed patient with NON-ED Provider: {SH ED Lamonte Sakai MDM - ANOTHER PROVIDER:28381}      ED Disposition   ED Disposition: No ED Disposition Set      ED Clinical Impression   ED Clinical Impression:   Other chest pain      ED Patient Status   Patient Status:   {SH ED [facility] PATIENT STATUS:[phone]}        ED Medical Evaluation Initiated   Medical Evaluation Initiated:   Yes, filed at 12/10/16 1326  by Sherri Sear, MD             Sherri Sear, MD  12/10/16 1326      Scribe Attestation: Paulla Fore, have acted as a Neurosurgeon for Sherri Sear, MD 1:27 PM 12/10/2016     Physician Attestation: Marland Kitchen

## 2016-12-16 NOTE — ED Provider Notes
uterus is retroflexed and measures 9 x 4.2 x 3.8 cm transvaginally.  It is homogeneous in echogenicity.  No focal lesions are seen. Endometrium: The endometrium measures 5 mm transvaginally, normal. Cervix: The cervix is unremarkable. Right ovary: The right ovary measures 3.7 x 3.4 x 2.7 cm transvaginally. There is a 2.3 x 2.3 cm avascular lesion with peripheral linear echogenicity likely representing retractile clot. Left ovary: Not visualized. Free fluid: A small amount of pelvic free fluid is present within the cul-de-sac, likely physiologic.     Impression: 2.3 cm right hemorrhagic cyst with retractile clot. I personally reviewed the images and the residents findings and agree with the above. Read By - Nigel Sloop  Electronically Verified By - Nigel Sloop  Released Date Time - 12/10/2016 4:20 PM  Resident - Laban Emperor        EKG (Read by ED Provider):  not applicable        ED Course & Re-Evaluation     ED Course   26 y.o. female c/o abd pain x1 week and asthma exacerbation today. Nebs given. Will check pelvic exam and labs.  Sherri Sear, MD 1:38 PM 12/10/2016      Pelvic exam remarkable for white vaginal d/c and mild tenderness to uterus and bilat adnexa. [place] ordered.  Sherri Sear, MD 2:21 PM 12/10/2016      4:53 PM  I assumed care of pt pending u/s.   Patieint diagnosis discussed. Patient advised to return to the ER if there is any sudden worsening of symptoms or any new condition interpreted as possible emergency. Medications will be taken as directed. Follow up discussed. Patient offered opportunity to answer questions.      MDM   Decide to obtain history from someone other than the patient: No    Decide to obtain previous medical records: Yes - The review of the records selected below is documented in the ED Note : ( Prior ED visit )    Clinical Lab Test(s): Ordered and Reviewed    Diagnostic Tests (Radiology, EKG): Ordered and Reviewed

## 2016-12-16 NOTE — ED Provider Notes
mouth every 6 hours as needed for pain.       Past Medical History:   Diagnosis Date   ? Asthma    ? Chlamydia infection complicating pregnancy     03/20/11   ? Gonorrhea complicating pregnancy     03/20/11   ? HIV exposure     per pt mother and father deceased from AIDS       Past Surgical History:   Procedure Laterality Date   ? CESAREAN SECTION     ? ECTOPIC PREGNANCY (ABDOMINAL) N/A 10/11/2013    ECTOPIC PREGNANCY (ABDOMINAL) performed by Valinda Hoar, MD at [facility]   ? LAPAROSCOPY ECTOPIC PREGNANCY N/A 10/11/2013    LAPAROSCOPY ECTOPIC PREGNANCY performed by Valinda Hoar, MD at [facility]       Family History   Problem Relation Age of Onset   ? Diabetes Other    ? High Blood Pressure Other    ? Diabetes Maternal Grandmother        Social History     Social History   ? Marital status: Single     Spouse name: N/A   ? Number of children: N/A   ? Years of education: N/A     Social History Main Topics   ? Smoking status: Never Smoker   ? Smokeless tobacco: Never Used   ? Alcohol use Yes      Comment: social   ? Drug use: No   ? Sexual activity: Yes     Partners: Male     Other Topics Concern   ? None     Social History Narrative       Review of Systems   Constitutional: Negative for fever and chills.   HENT: Negative for nasal congestion and nasal discharge.    Eyes: Negative for photophobia and pain.   Respiratory: Positive for wheezing. Negative for cough and shortness of breath.    Cardiovascular: Negative for chest pain and leg swelling.   Gastrointestinal: Positive for nausea and abdominal pain. Negative for vomiting and diarrhea.   Genitourinary: Negative for dysuria and difficulty urinating.   Musculoskeletal: Negative for back pain and gait problem.   Skin: Negative for rash and wound.   Neurological: Negative for light-headedness and headaches.   All other systems reviewed and are negative.      Physical Exam       ED Triage Vitals   BP 12/10/16 1334 116/86   Pulse 12/10/16 1334 74

## 2016-12-16 NOTE — ED Provider Notes
Resp 12/10/16 1334 18   Temp 12/10/16 1334 36.7 ?C (98 ?F)   Temp src 12/10/16 1334 Oral   Height 12/10/16 1334 1.6 m   Weight 12/10/16 1334 99.8 kg   SpO2 12/10/16 1334 100 %   BMI (Calculated) 12/10/16 1334 39.05             Physical Exam   Constitutional: She is oriented to person, place, and time. She appears well-developed and well-nourished. No distress.   HENT:   Head: Normocephalic and atraumatic.   Right Ear: External ear normal.   Left Ear: External ear normal.   Eyes: Conjunctivae are normal.   Cardiovascular: Normal rate, regular rhythm, normal heart sounds and intact distal pulses.    Pulmonary/Chest: Effort normal and breath sounds normal. No respiratory distress.   Abdominal: Soft. There is tenderness in the right lower quadrant, suprapubic area, left upper quadrant and left lower quadrant. There is no rigidity, no rebound and no guarding.   Musculoskeletal: Normal range of motion. She exhibits no edema.   Neurological: She is alert and oriented to person, place, and time.   Skin: Skin is warm and dry. She is not diaphoretic.   Nursing note and vitals reviewed.      Differential DDx: Pregnancy, ectopic pregnancy, STD, asthma exacerbation, other    Is this an Emergent Medical Condition? Yes - Severe Pain/Acute Onset of Symptons  409.901 FS  641.19 FS  627.732 (16) FS    ED Workup   Procedures    Labs:  - - No data to display      Imaging (Read by ED Provider):  not applicable      EKG (Read by ED Provider):  not applicable        ED Course & Re-Evaluation     ED Course   26 y.o. female c/o abd pain x1 week and asthma exacerbation today. Nebs given. Will check pelvic exam and labs.  Sherri Sear, MD 1:38 PM 12/10/2016          MDM   Decide to obtain history from someone other than the patient: No    Decide to obtain previous medical records: Yes - The review of the records selected below is documented in the ED Note : ( Prior ED visit )    Clinical Lab Test(s): Ordered and Reviewed

## 2016-12-16 NOTE — ED Provider Notes
Independent Visualization (ED [place], Wet Prep, Other): Yes - Documented in ED Provider Note    Discussed patient with NON-ED Provider: None      ED Disposition   ED Disposition: Discharge      ED Clinical Impression   ED Clinical Impression:   Chest pain  Chest tightness  Cyst of right ovary  Pelvic pain      ED Patient Status   Patient Status:   Good        ED Medical Evaluation Initiated   Medical Evaluation Initiated:   Yes, filed at 12/10/16 1326  by Sherri Sear, MD             Sherri Sear, MD  12/10/16 1326      Scribe Attestation: Paulla Fore, have acted as a Neurosurgeon for Sherri Sear, MD 1:27 PM 12/10/2016     Physician Attestation: Si Raider, MD  12/10/16 1654       Si Raider, MD  12/14/16 1719       Si Raider, MD  12/14/16 Si Gaul       Si Raider, MD  12/15/16 1244

## 2016-12-16 NOTE — ED Provider Notes
CO2 26  21 - 29 mmol/L    Urea Nitrogen 7  6 - 22 mg/dL    Creatinine 0.87  0.51 - 0.95 mg/dL    BUN/Creatinine Ratio 8.0  6.0 - 22.0 (calc)    Glucose 92  71 - 99 mg/dL    Calcium 9.1  8.6 - 10.0 mg/dL    Osmolality Calc 269.6      Anion Gap 9  4 - 16 mmol/L    EGFR >59  mL/min/1.73M2    Comment:   Reference range: =>90 ml/min/1.73M2  eGFR estimates are unable to accurately differentiate levels of GFR above 60 ml/min/1.73M2.   HCG, QUALITATIVE, URINE   CBC AND DIFFERENTIAL         Imaging (Read by ED Provider):  ***      EKG (Read by ED Provider):  not applicable        ED Course & Re-Evaluation     ED Course   26 y.o. female c/o abd pain x1 week and asthma exacerbation today. Nebs given. Will check pelvic exam and labs.  Jackqulyn Livings, MD 1:38 PM 12/10/2016      Pelvic exam remarkable for white vaginal d/c and mild tenderness to uterus and bilat adnexa. [place] ordered.  Jackqulyn Livings, MD 2:21 PM 12/10/2016      MDM   Decide to obtain history from someone other than the patient: No    Decide to obtain previous medical records: Yes - The review of the records selected below is documented in the ED Note : ( Prior ED visit )    Clinical Lab Test(s): Ordered and Reviewed    Diagnostic Tests (Radiology, EKG): Ordered and Reviewed    Independent Visualization (ED [place], Wet Prep, Other): Yes - Documented in ED Provider Note    Discussed patient with NON-ED Provider: None      ED Disposition   ED Disposition: No ED Disposition Set      ED Clinical Impression   ED Clinical Impression:   Other chest pain  Chest pain  SOB (shortness of breath)      ED Patient Status   Patient Status:   Good        ED Medical Evaluation Initiated   Medical Evaluation Initiated:   Yes, filed at 12/10/16 1326  by Jackqulyn Livings, MD             Jackqulyn Livings, MD  12/10/16 1326      Scribe Attestation: Willette Alma, have acted as a Education administrator for Jackqulyn Livings, MD 1:27 PM 12/10/2016     Physician Attestation: Marland Kitchen

## 2016-12-16 NOTE — ED Provider Notes
History     Chief Complaint   Patient presents with   ? Abdominal Pain   ? Other     chest tightness       HPI Comments: 26 y.o. female with h/o asthma presents with c/o abd pain x1 week. Pt states the pain is localized to her lower abd. She is sexually active with one partner and does not use protection. Reports nausea but denies fever, emesis, diarrhea, vaginal bleeding, or vaginal d/c. She also noticed wheezing this AM but ran out of her inhaler. Shx C-section and ectopic pregnancy rupture.    Patient is a 26 y.o. female presenting with abdominal pain. The history is provided by the patient. No language interpreter was used.   Abdominal Pain   Pain location:  Suprapubic  Pain radiates to:  Does not radiate  Pain severity:  Moderate  Onset quality:  Gradual  Duration:  1 week  Timing:  Constant  Progression:  Unchanged  Chronicity:  New  Relieved by:  Nothing  Worsened by:  Nothing  Ineffective treatments:  None tried  Associated symptoms: nausea    Associated symptoms: no chest pain, no chills, no cough, no diarrhea, no dysuria, no fever, no shortness of breath and no vomiting        Allergies   Allergen Reactions   ? No Known Allergies      No Known Allergies       Patient's Medications   New Prescriptions    No medications on file   Previous Medications    ALBUTEROL (PROAIR HFA;VENTOLIN HFA) 108 (90 BASE) MCG/ACT AEROSOL SOLUTION    Inhale 2 puffs every 6 hours as needed for wheezing (Dispense with spacer and teach how to use).    IBUPROFEN (ADVIL,MOTRIN) 800 MG TABLET    Take 1 tablet by mouth every 6 hours as needed for pain.    ONDANSETRON (ZOFRAN-ODT) 4 MG TABLET DISPERSIBLE    Dissolve 1 tablet in mouth every 8 hours as needed for nausea or vomiting.   Modified Medications    No medications on file   Discontinued Medications    DICYCLOMINE (BENTYL) 20 MG TABLET    Take 1 tablet by mouth 3 times daily as needed.    HYDROCODONE-ACETAMINOPHEN (NORCO) 5-325 MG TABLET    Take 1 tablet by

## 2016-12-16 NOTE — ED Provider Notes
Absolute NRBC Count 0.00      Neutrophils % 58.0  34.0 - 73.0 %    Lymphocytes % 33.4  25.0 - 45.0 %    Monocytes % 7.8 (*) 2.0 - 6.0 %    Eosinophils % 0.3 (*) 1.0 - 4.0 %    Immature Granulocytes % 0.2  0.0 - 2.0 %    Neutrophils Absolute 3.51  1.80 - 8.70 x10E3/uL    Lymphocytes Absolute 2.02  x10E3/uL    Monocytes Absolute 0.47  x10E3/uL    Eosinophils Absolute 0.02  x10E3/uL    Basophil Absolute 0.02  x10E3/uL    Basophils % 0.3  0 - 1 %   LACTIC ACID, PLASMA - Normal    LACTIC ACID 0.9  0.7 - 2.7 mmol/L   LIPASE - Normal    Lipase 26  0 - 60 U/L   HCG, QUALITATIVE, URINE - Normal    HCG-Urine Negative  Negative   WET PREP    Trichomonas Vaginalis Negative for Trichomonas       YEAST Negative for Yeast     CHLAMYDIA/ GONORRHEA DETECTIO*   BASIC METABOLIC PANEL    Sodium 419  135 - 145 mmol/L    Potassium 3.9  3.3 - 4.6 mmol/L    Chloride 101  101 - 110 mmol/L    CO2 26  21 - 29 mmol/L    Urea Nitrogen 7  6 - 22 mg/dL    Creatinine 0.87  0.51 - 0.95 mg/dL    BUN/Creatinine Ratio 8.0  6.0 - 22.0 (calc)    Glucose 92  71 - 99 mg/dL    Calcium 9.1  8.6 - 10.0 mg/dL    Osmolality Calc 269.6      Anion Gap 9  4 - 16 mmol/L    EGFR >59  mL/min/1.73M2    Comment:   Reference range: =>90 ml/min/1.73M2  eGFR estimates are unable to accurately differentiate levels of GFR above 60 ml/min/1.73M2.   CBC AND DIFFERENTIAL         Imaging (Read by ED Provider):  US Pelvic Transabdominal & Transvaginal W Doppler    Result Date: 12/10/2016  Procedure:  US PELVIC TRANSABDOMINAL & TRANSVAGINAL W DOPPLER Ordering History:  Pelvic pain Additional History: None Comparison: Pelvic ultrasound 04/15/2016; CT kidney stone 09/17/2015 Technique: Transabdominal pelvic ultrasound was initially performed with limited visualization of the endometrium and/or adnexal structures.  Therefore, transvaginal pelvic ultrasound was performed for better evaluation. Findings: Uterus: The

## 2016-12-16 NOTE — ED Provider Notes
ALBUMIN/GLOBULIN RATIO 1.2  (calc)    Calc Total Globuin 3.5  gm/dL   URINALYSIS W/MICROSCOPY - Abnormal     Color -Ur Yellow  Insurance risk surveyor, UA Hazy  Hazy    Specific Gravity, Urine 1.018  1.003 - 1.030    pH, Urine 6.0  4.5 - 8.0    Protein-UA Negative  Negative mg/dL    Glucose -Ur Negative  Negative mg/dL    Ketones UA Negative  Negative mg/dL    Bilirubin -Ur Negative  Negative    Blood -Ur Negative  Negative    Nitrite -Ur Negative  Negative    Urobilinogen -Ur 2.0 (*) Normal mg/dL    Leukocytes -Ur Trace (*) Negative    RBC -Ur 1  0 - 5 /HPF    WBC -Ur 2  0 - 5 /HPF    Squam Epithel, UA 4  Not established /HPF    Bacteria -Ur Rare (*) None seen /HPF    Mucus -Ur Rare  2+ /LPF    ASCORBIC ACID 40.0  20 mg/dL   CBC AUTODIFF - Abnormal     WBC 6.05  4.5 - 11 x10E3/uL    RBC 4.74  4.20 - 5.40 x10E6/uL    Hemoglobin 12.5  12.0 - 16.0 g/dL    Hematocrit 16.1  09.6 - 47.0 %    MCV 80.4 (*) 82.0 - 101.0 fl    MCH 26.4 (*) 27.0 - 34.0 pg    MCHC 32.8  31.0 - 36.0 g/dL    RDW 04.5  40.9 - 81.1 %    Platelet Count 342  140 - 440 thou/cu mm    MPV 8.9 (*) 9.5 - 11.5 fl    nRBC % 0.0  0.0 - 1.0 %    Absolute NRBC Count 0.00      Neutrophils % 58.0  34.0 - 73.0 %    Lymphocytes % 33.4  25.0 - 45.0 %    Monocytes % 7.8 (*) 2.0 - 6.0 %    Eosinophils % 0.3 (*) 1.0 - 4.0 %    Immature Granulocytes % 0.2  0.0 - 2.0 %    Neutrophils Absolute 3.51  1.80 - 8.70 x10E3/uL    Lymphocytes Absolute 2.02  x10E3/uL    Monocytes Absolute 0.47  x10E3/uL    Eosinophils Absolute 0.02  x10E3/uL    Basophil Absolute 0.02  x10E3/uL    Basophils % 0.3  0 - 1 %   LACTIC ACID, PLASMA - Normal    LACTIC ACID 0.9  0.7 - 2.7 mmol/L   LIPASE - Normal    Lipase 26  0 - 60 U/L   WET PREP    Trichomonas Vaginalis Negative for Trichomonas       YEAST Negative for Yeast     CHLAMYDIA/ GONORRHEA DETECTIO*   BASIC METABOLIC PANEL    Sodium 136  914 - 145 mmol/L    Potassium 3.9  3.3 - 4.6 mmol/L    Chloride 101  101 - 110 mmol/L

## 2016-12-16 NOTE — ED Provider Notes
Musculoskeletal: Normal range of motion. She exhibits no edema.   Neurological: She is alert and oriented to person, place, and time.   Skin: Skin is warm and dry. She is not diaphoretic.   Nursing note and vitals reviewed.      Differential DDx: Pregnancy, ectopic pregnancy, STD, asthma exacerbation, other    Is this an Emergent Medical Condition? Yes - Severe Pain/Acute Onset of Symptons  409.901 FS  641.19 FS  627.732 (16) FS    ED Workup   Procedures    Labs:  -   HEPATIC FUNCTION PANEL - Abnormal        Result Value Ref Range    Albumin 4.2  3.8 - 4.9 g/dL    Total Bilirubin 0.4  0.2 - 1.0 mg/dL    Bilirubin, Direct 0.2  0.0 - 0.2 mg/dL    Bilirubin, Indirect 0.2  8mg /dL mg/dL    Alkaline Phosphatase 77  35 - 104 IU/L    AST 15  14 - 33 IU/L    ALT 8 (*) 10 - 42 IU/L    Total Protein 7.7  6.5 - 8.3 g/dL    ALBUMIN/GLOBULIN RATIO 1.2  (calc)    Calc Total Globuin 3.5  gm/dL   URINALYSIS W/MICROSCOPY - Abnormal     Color -Ur Yellow  Insurance risk surveyor, UA Hazy  Hazy    Specific Gravity, Urine 1.018  1.003 - 1.030    pH, Urine 6.0  4.5 - 8.0    Protein-UA Negative  Negative mg/dL    Glucose -Ur Negative  Negative mg/dL    Ketones UA Negative  Negative mg/dL    Bilirubin -Ur Negative  Negative    Blood -Ur Negative  Negative    Nitrite -Ur Negative  Negative    Urobilinogen -Ur 2.0 (*) Normal mg/dL    Leukocytes -Ur Trace (*) Negative    RBC -Ur 1  0 - 5 /HPF    WBC -Ur 2  0 - 5 /HPF    Squam Epithel, UA 4  Not established /HPF    Bacteria -Ur Rare (*) None seen /HPF    Mucus -Ur Rare  2+ /LPF    ASCORBIC ACID 40.0  20 mg/dL   CBC AUTODIFF - Abnormal     WBC 6.05  4.5 - 11 x10E3/uL    RBC 4.74  4.20 - 5.40 x10E6/uL    Hemoglobin 12.5  12.0 - 16.0 g/dL    Hematocrit 78.2  95.6 - 47.0 %    MCV 80.4 (*) 82.0 - 101.0 fl    MCH 26.4 (*) 27.0 - 34.0 pg    MCHC 32.8  31.0 - 36.0 g/dL    RDW 21.3  08.6 - 57.8 %    Platelet Count 342  140 - 440 thou/cu mm    MPV 8.9 (*) 9.5 - 11.5 fl    nRBC % 0.0  0.0 - 1.0 %

## 2016-12-16 NOTE — ED Provider Notes
Resp 12/10/16 1334 18   Temp 12/10/16 1334 36.7 ?C (98 ?F)   Temp src 12/10/16 1334 Oral   Height 12/10/16 1334 1.6 m   Weight 12/10/16 1334 99.8 kg   SpO2 12/10/16 1334 100 %   BMI (Calculated) 12/10/16 1334 39.05             Physical Exam   Constitutional: She is oriented to person, place, and time. She appears well-developed and well-nourished. No distress.   HENT:   Head: Normocephalic and atraumatic.   Right Ear: External ear normal.   Left Ear: External ear normal.   Eyes: Conjunctivae are normal.   Cardiovascular: Normal rate, regular rhythm, normal heart sounds and intact distal pulses.    Pulmonary/Chest: Effort normal and breath sounds normal. No respiratory distress.   Abdominal: Soft. There is tenderness in the right lower quadrant, suprapubic area, left upper quadrant and left lower quadrant. There is no rigidity, no rebound and no guarding.   Genitourinary: Cervix exhibits no motion tenderness. Right adnexum displays tenderness (mild). Left adnexum displays tenderness (mild). Vaginal discharge (white) found.   Genitourinary Comments: Mild tenderness to uterus. Os closed.     Musculoskeletal: Normal range of motion. She exhibits no edema.   Neurological: She is alert and oriented to person, place, and time.   Skin: Skin is warm and dry. She is not diaphoretic.   Nursing note and vitals reviewed.      Differential DDx: Pregnancy, ectopic pregnancy, STD, asthma exacerbation, other    Is this an Emergent Medical Condition? Yes - Severe Pain/Acute Onset of Symptons  409.901 FS  641.19 FS  627.732 (16) FS    ED Workup   Procedures    Labs:  -   HEPATIC FUNCTION PANEL - Abnormal        Result Value Ref Range    Albumin 4.2  3.8 - 4.9 g/dL    Total Bilirubin 0.4  0.2 - 1.0 mg/dL    Bilirubin, Direct 0.2  0.0 - 0.2 mg/dL    Bilirubin, Indirect 0.2  8mg /dL mg/dL    Alkaline Phosphatase 77  35 - 104 IU/L    AST 15  14 - 33 IU/L    ALT 8 (*) 10 - 42 IU/L    Total Protein 7.7  6.5 - 8.3 g/dL

## 2016-12-16 NOTE — ED Provider Notes
Independent Visualization (ED [place], Wet Prep, Other): Yes - Documented in ED Provider Note    Discussed patient with NON-ED Provider: None      ED Disposition   ED Disposition: Discharge      ED Clinical Impression   ED Clinical Impression:   Chest pain  Chest tightness  Cyst of right ovary  Pelvic pain      ED Patient Status   Patient Status:   Good        ED Medical Evaluation Initiated   Medical Evaluation Initiated:   Yes, filed at 12/10/16 1326  by Sherri Sear, MD             Sherri Sear, MD  12/10/16 1326      Scribe Attestation: Paulla Fore, have acted as a Neurosurgeon for Sherri Sear, MD 1:27 PM 12/10/2016     Physician Attestation: Si Raider, MD  12/10/16 1654       Si Raider, MD  12/14/16 1719       Si Raider, MD  12/14/16 1720

## 2017-01-10 ENCOUNTER — Emergency Department: Admit: 2017-01-10 | Discharge: 2017-01-10

## 2017-01-10 ENCOUNTER — Inpatient Hospital Stay: Admit: 2017-01-10 | Discharge: 2017-01-10

## 2017-01-10 DIAGNOSIS — R102 Pelvic and perineal pain: Secondary | ICD-10-CM

## 2017-01-10 DIAGNOSIS — O219 Vomiting of pregnancy, unspecified: Secondary | ICD-10-CM

## 2017-01-10 DIAGNOSIS — R109 Unspecified abdominal pain: Secondary | ICD-10-CM

## 2017-01-10 DIAGNOSIS — J45909 Unspecified asthma, uncomplicated: Principal | ICD-10-CM

## 2017-01-10 DIAGNOSIS — Z331 Pregnant state, incidental: Secondary | ICD-10-CM

## 2017-01-10 DIAGNOSIS — O98219 Gonorrhea complicating pregnancy, unspecified trimester: Secondary | ICD-10-CM

## 2017-01-10 DIAGNOSIS — O26891 Other specified pregnancy related conditions, first trimester: Principal | ICD-10-CM

## 2017-01-10 DIAGNOSIS — Z3A00 Weeks of gestation of pregnancy not specified: Secondary | ICD-10-CM

## 2017-01-10 DIAGNOSIS — Z206 Contact with and (suspected) exposure to human immunodeficiency virus [HIV]: Secondary | ICD-10-CM

## 2017-01-10 MED ORDER — NITROFURANTOIN MONOHYD MACRO 100 MG PO CAPS
100 mg | Freq: Two times a day (BID) | ORAL | 0 refills | Status: CP
Start: 2017-01-10 — End: ?

## 2017-01-10 MED ORDER — ACETAMINOPHEN 650 MG/20.3ML PO LIQD UD
650 mg | Freq: Once | ORAL | Status: CP
Start: 2017-01-10 — End: ?

## 2017-01-10 MED ORDER — BOLUS IV FLUID JX
1000 mL | Freq: Once | INTRAVENOUS | Status: CP
Start: 2017-01-10 — End: ?

## 2017-01-10 MED ORDER — PRENATAL MULTI +DHA 27-0.8-228 MG PO CAPS
1 | ORAL_TABLET | Freq: Every day | ORAL | 0 refills | Status: CP
Start: 2017-01-10 — End: ?

## 2017-01-10 NOTE — ED Notes
Bed: ED-25  Expected date:   Expected time:   Means of arrival:   Comments:  Hold 3 JTB

## 2017-01-10 NOTE — ED Notes
Reoved IV catheter from L AC. Catheter tip was fully intact upon removal. Site had no redness, tenderness or swelling noted at this time. Applied pressure with a 4x4 on site and secured with tape. Pt was give two prescriptions - prenatal and Macrobid. Pt was able to verbalize understanding of DC instructions and education. Pt was able to ambulate without assistance to main lobby.

## 2017-01-10 NOTE — ED Triage Notes
Pt to ed bed with c/o abd pain x 1 week in lower abd. +N/V/D

## 2017-01-11 NOTE — ED Provider Notes
per pt mother and father deceased from AIDS       Past Surgical History:   Procedure Laterality Date   ? CESAREAN SECTION     ? ECTOPIC PREGNANCY (ABDOMINAL) N/A 10/11/2013    ECTOPIC PREGNANCY (ABDOMINAL) performed by Valinda HoarBurnett, Erin Hale, MD at [facility]   ? LAPAROSCOPY ECTOPIC PREGNANCY N/A 10/11/2013    LAPAROSCOPY ECTOPIC PREGNANCY performed by Valinda HoarBurnett, Erin Hale, MD at [facility]       Family History   Problem Relation Age of Onset   ? Diabetes Other    ? High Blood Pressure Other    ? Diabetes Maternal Grandmother        Social History     Social History   ? Marital status: Single     Spouse name: N/A   ? Number of children: N/A   ? Years of education: N/A     Social History Main Topics   ? Smoking status: Never Smoker   ? Smokeless tobacco: Never Used   ? Alcohol use Yes      Comment: social   ? Drug use: No   ? Sexual activity: Yes     Partners: Male     Other Topics Concern   ? None     Social History Narrative   ? None       Review of Systems   Constitutional: Negative for fever and chills.   HENT: Negative for nasal congestion and sore throat.    Respiratory: Negative for cough and shortness of breath.    Cardiovascular: Negative for chest pain.   Gastrointestinal: Positive for nausea, vomiting (w/ "streaks of blood") and abdominal pain. Negative for diarrhea.   Genitourinary: Negative for dysuria, hematuria, vaginal bleeding and vaginal discharge.   Musculoskeletal: Negative for myalgias and arthralgias.   Skin: Negative for rash.   Neurological: Negative for weakness and light-headedness.   All other systems reviewed and are negative.      Physical Exam     ED Triage Vitals [01/10/17 1524]   BP 111/63   Pulse 91   Resp 16   Temp 36.8 ?C (98.2 ?F)   Temp src Oral   Height 1.6 m   Weight 100.6 kg   SpO2 100 %   BMI (Calculated) 39.37             Physical Exam   Constitutional: She is oriented to person, place, and time. She appears well-developed and well-nourished. No distress.   HENT:

## 2017-01-11 NOTE — ED Provider Notes
History     Chief Complaint   Patient presents with   ? Abdominal Pain       26 y.o. female with h/o asthma presents with c/o abdominal pain x few days.  Pain is localized to midabdomen. Patient also notes two episodes of vomiting 2 days ago with "streaks of blood." Denies any diarrhea or hematochezia. Denies any concern for pregnancy. LMP: end of April. Prior h/o ectopic pregnancy. SHx: C-section.      The history is provided by the patient.   Abdominal Pain   Pain location:  Generalized  Pain radiates to:  Does not radiate  Pain severity:  Moderate  Onset quality:  Gradual  Timing:  Constant  Progression:  Unchanged  Chronicity:  New  Relieved by:  Nothing  Worsened by:  Nothing  Ineffective treatments:  None tried  Associated symptoms: nausea and vomiting (w/ "streaks of blood")    Associated symptoms: no chest pain, no chills, no cough, no diarrhea, no dysuria, no fever, no hematuria, no shortness of breath, no sore throat, no vaginal bleeding and no vaginal discharge        Allergies   Allergen Reactions   ? No Known Allergies      No Known Allergies       Patient's Medications   New Prescriptions    No medications on file   Previous Medications    No medications on file   Modified Medications    No medications on file   Discontinued Medications    ALBUTEROL (PROAIR HFA;VENTOLIN HFA) 108 (90 BASE) MCG/ACT AEROSOL SOLUTION    Inhale 2 puffs every 6 hours as needed for wheezing (Dispense with spacer and teach how to use).    IBUPROFEN (ADVIL,MOTRIN) 800 MG PO TABLET    Take 1 tablet by mouth every 8 hours as needed for pain.    ONDANSETRON (ZOFRAN-ODT) 4 MG TABLET DISPERSIBLE    Dissolve 1 tablet in mouth every 8 hours as needed for nausea or vomiting.    TRAMADOL (ULTRAM) 50 MG PO TABLET    1-2 tabs every 6 hours prn pain       Past Medical History:   Diagnosis Date   ? Asthma    ? Chlamydia infection complicating pregnancy     03/20/11   ? Gonorrhea complicating pregnancy     03/20/11   ? HIV exposure

## 2017-01-11 NOTE — ED Provider Notes
ED Patient Status   Patient Status:   [phone]}        ED Medical Evaluation Initiated   Medical Evaluation Initiated:  Yes, filed at 01/10/17 1502  by Jolene SchimkeGalwankar, Sagar C, MD            Jolene SchimkeGalwankar, Sagar C, MD  01/10/17 1502    Scribe Attestation: Delsa GranaI, Mikel Cress, have acted as a Neurosurgeonscribe for Jolene SchimkeGalwankar, Sagar C, MD 3:03 PM 01/10/2017     Physician Attestation: Marland Kitchen***

## 2017-01-11 NOTE — ED Provider Notes
Yes, filed at 01/10/17 1502  by Jolene SchimkeGalwankar, Sagar C, MD            Jolene SchimkeGalwankar, Sagar C, MD  01/10/17 1502    Scribe Attestation: Delsa GranaI, Mikel Cress, have acted as a Neurosurgeonscribe for Jolene SchimkeGalwankar, Sagar C, MD 3:03 PM 01/10/2017     Physician Attestation: Karlton Lemon***        Galwankar, Cipriano BunkerSagar C, MD  01/10/17 [phone]

## 2017-01-11 NOTE — ED Provider Notes
cul-de-sac.     Impression: In light of the patient's clinical history the findings could be related to a completed abortion, a very early nonvisualized intrauterine pregnancy or a nonvisualized ectopic pregnancy. Further clinical correlation with serial quantitative beta hCG values is advised. Read By Burr Medico- Gregory Wynn M.D.  Electronically Verified By - Burr MedicoGregory Wynn M.D.  Released Date Time - 01/10/2017 5:35 PM  Resident -       EKG (Read by ED Provider):  not applicable        ED Course & Re-Evaluation     ED Course      26 y.o. female with h/o asthma presents with c/o abdominal pain x few days. Patient also notes two episodes of vomiting 2 days ago w/ "streaks of blood." Will order labs. IV fluids and Tylenol to be given. Will re-evaluate.  Jolene SchimkeGalwankar, Sagar C, MD 3:34 PM 01/10/2017       Pregnancy positive. OB US ordered.   Jolene SchimkeGalwankar, Sagar C, MD 4:17 PM 01/10/2017        Final findings and deduction:  No RUQ or RLQ tenderness  Tenderness mainly to midabdomen, uper epigastric region  White count nl  Lactic acid nl  Lipase nl  No gallstones on US  Prior h/o ectopic  Will speak with OB and arrange f/u with her  Sat down and explained everything to the patient  Discussed that pathology is a dynamic process  I am only seeing her at one point in time  Advised to return if experiencing any worsening sxs in any way  Jolene SchimkeGalwankar, Sagar C, MD 6:24 PM 01/10/2017        MDM   Decide to obtain history from someone other than the patient: No    Decide to obtain previous medical records: No    Clinical Lab Test(s): Ordered and Reviewed    Diagnostic Tests (Radiology, EKG): Ordered and Reviewed    Independent Visualization (ED US, Wet Prep, Other): No    Discussed patient with NON-ED Provider: OB      ED Disposition   ED Disposition: No ED Disposition Set      ED Clinical Impression   ED Clinical Impression:   No Clinical Impression Set      ED Patient Status   Patient Status:   [facility]{SH ED JX PATIENT STATUS:[phone]}

## 2017-01-11 NOTE — ED Provider Notes
abnormality. There is no intra or extrahepatic biliary ductal dilatation with the common duct measuring 2.6 millimeters. The liver and spleen are both homogeneous and normal in echogenicity and normal in size with the liver measuring 15.5 centimeters in cephalocaudal dimension and the spleen measuring 7.7 centimeters. The main portal vein measures 0.70 centimeters in diameter and demonstrates hepatopedal flow. Only a portion of the pancreas (the body) is visualized but this appears normal in size, shape and echogenicity. The majority of the pancreas is obscured by overlying bowel gas. Both kidneys are normal in parenchymal echogenicity and there is no evidence of a renal mass, calculus or hydronephrosis. The kidneys are normal and relatively symmetrical in size with the right measuring 9.2 x 4.0 centimeters and the left measuring 9.5 x 5.1 centimeters. There is no abnormal fluid collection identified within the visualized portion of the abdomen. The superior aspects of the abdominal aorta and inferior vena cava appear within normal limits.     Impression: Limited visualization/evaluation of the pancreas as discussed above. Otherwise, an unremarkable examination. Read By Burr Medico- Gregory Wynn M.D.  Electronically Verified By - Burr MedicoGregory Wynn M.D.  Released Date Time - 01/10/2017 5:39 PM  Resident -     Per Radiology: [place] Ob Transvaginal Transabdominal <14 Wks    Result Date: 01/10/2017  Transabdominal and transvaginal pelvic sonography/early obstetrical sonography: Indication: Positive pregnancy test. Pelvic pain. Comparison: Pelvic sonography dated 12/10/2016. Findings: The uterus is within the limits of normal in size measuring approximately 10.8 x 3.8 x 4.7 cm. The endometrial echoes measure 5.6 mm. The uterus is homogeneous in echogenicity with no evidence of a gestational sac or other finding to suggest an intrauterine pregnancy. The left ovary is not visualized

## 2017-01-11 NOTE — ED Provider Notes
History     Chief Complaint   Patient presents with   ? Abdominal Pain       26 y.o. female with h/o asthma presents with c/o abdominal pain x few days.  Pain is localized to epigastric. Patient also notes two episodes of vomiting 2 days ago Denies any diarrhea or hematochezia. Denies any concern for pregnancy. LMP: end of April. Prior h/o ectopic pregnancy. SHx: C-section.      The history is provided by the patient.   Abdominal Pain   Pain location:  Generalized  Pain radiates to:  Does not radiate  Pain severity:  Moderate  Onset quality:  Gradual  Timing:  Constant  Progression:  Unchanged  Chronicity:  New  Relieved by:  Nothing  Worsened by:  Nothing  Ineffective treatments:  None tried  Associated symptoms: nausea and vomiting    Associated symptoms: no chest pain, no chills, no cough, no diarrhea, no dysuria, no fever, no hematuria, no shortness of breath, no sore throat, no vaginal bleeding and no vaginal discharge        Allergies   Allergen Reactions   ? No Known Allergies      No Known Allergies       Patient's Medications   New Prescriptions    PRENATAL MULTI +DHA 27-0.8-228 MG PO CAPSULE    Take 1 capsule by mouth daily.   Previous Medications    No medications on file   Modified Medications    No medications on file   Discontinued Medications    ALBUTEROL (PROAIR HFA;VENTOLIN HFA) 108 (90 BASE) MCG/ACT AEROSOL SOLUTION    Inhale 2 puffs every 6 hours as needed for wheezing (Dispense with spacer and teach how to use).    IBUPROFEN (ADVIL,MOTRIN) 800 MG PO TABLET    Take 1 tablet by mouth every 8 hours as needed for pain.    ONDANSETRON (ZOFRAN-ODT) 4 MG TABLET DISPERSIBLE    Dissolve 1 tablet in mouth every 8 hours as needed for nausea or vomiting.    TRAMADOL (ULTRAM) 50 MG PO TABLET    1-2 tabs every 6 hours prn pain       Past Medical History:   Diagnosis Date   ? Asthma    ? Chlamydia infection complicating pregnancy     03/20/11   ? Gonorrhea complicating pregnancy     03/20/11   ? HIV exposure

## 2017-01-11 NOTE — ED Provider Notes
Impression: In light of the patient's clinical history the findings could be related to a completed abortion, a very early nonvisualized intrauterine pregnancy or a nonvisualized ectopic pregnancy. Further clinical correlation with serial quantitative beta hCG values is advised. Read By Burr Medico- Gregory Wynn M.D.  Electronically Verified By - Burr MedicoGregory Wynn M.D.  Released Date Time - 01/10/2017 5:35 PM  Resident -       EKG (Read by ED Provider):  not applicable        ED Course & Re-Evaluation     ED Course      26 y.o. female with h/o asthma presents with c/o abdominal pain x few days. Patient also notes two episodes of vomiting 2 days ago w/ "streaks of blood." Will order labs. IV fluids and Tylenol to be given. Will re-evaluate.  Jolene SchimkeGalwankar, Sagar C, MD 3:34 PM 01/10/2017       Pregnancy positive. OB US ordered.   Jolene SchimkeGalwankar, Sagar C, MD 4:17 PM 01/10/2017        Final findings and deduction:  No RUQ or RLQ tenderness  Tenderness mainly to midabdomen, uper epigastric region  Appendicitis remains a posisbility and pt refused admission / further work up / obs  White count nl; Lactic acid nl; Lipase nl; No gallstones on US  Prior h/o ectopic hence D/W  with OB (Dr Lyn HollingsheadAlexander[facility]  [facility] and Fu in [facility]  Sat down and explained everything to the patient; Discussed that pathology is a dynamic process; I am only seeing her at one point in time  Advised to return if experiencing any worsening sxs in any way  Closing Discussion done with Patient which includes Review of all Results and Explnation of the POA which patient/ parents/ partners have understood  Karlton LemonGalwankar, Cipriano BunkerSagar C, MD 6:24 PM 01/10/2017        MDM   Decide to obtain history from someone other than the patient: No    Decide to obtain previous medical records: No    Clinical Lab Test(s): Ordered and Reviewed    Diagnostic Tests (Radiology, EKG): Ordered and Reviewed    Independent Visualization (ED US, Wet Prep, Other): No

## 2017-01-11 NOTE — ED Provider Notes
Gestation   HCG mIU/mL        Gestation    HCB mIU/mL    3          5.8-71.2            10        [id number]    4          9.5-750             12        [id number]    5          [id number]            14        13950-62530    6          158-31795           15        [phone]    7         [phone]          16         [phone]    8        [id number]          17         [phone]    9        [phone]          18         [phone]  Results may be affected by exogenous biotin consumption. Please consult the laboratory if an interference is suspected.   LACTIC ACID, PLASMA - Normal    LACTIC ACID 2.0  0.7 - 2.7 mmol/L   LIPASE - Normal    Lipase 28  0 - 60 U/L   WET PREP    Trichomonas Vaginalis Negative for Trichomonas       YEAST Positive for Yeast     CHLAMYDIA/ GONORRHEA DETECTIO*   CULTURE, URINE   RH ONLY    Rh Type Positive     CBC AND DIFFERENTIAL         Imaging (Read by ED Provider):  Per Radiology: [place] Abdomen Complete    Result Date: 01/10/2017  Abdominal sonography: Indication: Abdominal pain. Technique: Multiple gray scale images of the abdomen were performed in the transverse and longitudinal planes. The gray scale images were supplemented with color Doppler interrogation. Findings: The gallbladder is normal in appearance with no evidence of a calculus, wall thickening, dilatation or other abnormality. There is no intra or extrahepatic biliary ductal dilatation with the common duct measuring 2.6 millimeters. The liver and spleen are both homogeneous and normal in echogenicity and normal in size with the liver measuring 15.5 centimeters in cephalocaudal dimension and the spleen measuring 7.7 centimeters. The main portal vein measures 0.70 centimeters in diameter and demonstrates hepatopedal flow. Only a portion of the pancreas (the body) is visualized but this appears normal in size, shape and echogenicity. The majority of the pancreas is obscured by overlying

## 2017-01-11 NOTE — ED Provider Notes
Lymphocytes Absolute 1.66  x10E3/uL    Monocytes Absolute 0.39  x10E3/uL    Eosinophils Absolute 0.03  x10E3/uL    Basophil Absolute 0.02  x10E3/uL    Absolute Immature Granulocytes 0.02 (*) 0 - 0 x10E3/uL    Basophils % 0.4  0 - 1 %   COMPREHENSIVE METABOLIC PANEL   LACTIC ACID, PLASMA   LIPASE   URINALYSIS W/MICROSCOPY   CBC AND DIFFERENTIAL         Imaging (Read by ED Provider):  {Imaging [phone]}      EKG (Read by ED Provider):  not applicable        ED Course & Re-Evaluation     ED Course      26 y.o. female with h/o asthma presents with Burke/o abdominal pain x few days. Patient also notes two episodes of vomiting 2 days ago w/ "streaks of blood." Will order labs. IV fluids and Tylenol to be given. Will re-evaluate.  Tammy Burke, Tammy C, MD 3:34 PM 01/10/2017       ***        MDM   Decide to obtain history from someone other than the patient: {SH ED Lamonte SakaiJX MDM - OBTAIN GNFAOZH:08657}HISTORY:28378}    Decide to obtain previous medical records: [facility]{SH ED Lamonte SakaiJX MDM - PREVIOUS MED REC - NO QIO:96295}YES:28380}    Clinical Lab Test(s): {SH ED Lamonte SakaiJX MDM ORDERED AND REVIEWED:28124}    Diagnostic Tests (Radiology, EKG): N/A    Independent Visualization (ED US, Wet Prep, Other): No    Discussed patient with NON-ED Provider: {SH ED Lamonte SakaiJX MDM - ANOTHER PROVIDER:28381}      ED Disposition   ED Disposition: No ED Disposition Set      ED Clinical Impression   ED Clinical Impression:   No Clinical Impression Set      ED Patient Status   Patient Status:   {[facility]:[phone]}        ED Medical Evaluation Initiated   Medical Evaluation Initiated:  Yes, filed at 01/10/17 1502  by Tammy Burke, Tammy C, MD            Tammy Burke, Tammy C, MD  01/10/17 1502    Scribe Attestation: Delsa GranaI, Mikel Cress, have acted as a Neurosurgeonscribe for Tammy Burke, Tammy C, MD 3:03 PM 01/10/2017     Physician Attestation: Marland Kitchen***

## 2017-01-11 NOTE — ED Provider Notes
ED Medical Evaluation Initiated   Medical Evaluation Initiated:  Yes, filed at 01/10/17 1502  by Jolene SchimkeGalwankar, Sagar C, MD            Jolene SchimkeGalwankar, Sagar C, MD  01/10/17 1502    Scribe Attestation: Delsa GranaI, Mikel Cress, have acted as a Neurosurgeonscribe for Jolene SchimkeGalwankar, Sagar C, MD 3:03 PM 01/10/2017     Physician Attestation: Marland Kitchen***

## 2017-01-11 NOTE — ED Provider Notes
Pregnancy as incidental finding      ED Patient Status   Patient Status:   Good        ED Medical Evaluation Initiated   Medical Evaluation Initiated:  Yes, filed at 01/10/17 1502  by Jolene SchimkeGalwankar, Sagar C, MD            Jolene SchimkeGalwankar, Sagar C, MD  01/10/17 1502    Scribe Attestation: Delsa GranaI, Mikel Cress, have acted as a Neurosurgeonscribe for Jolene SchimkeGalwankar, Sagar C, MD 3:03 PM 01/10/2017     Physician Attestation: I have reviewed and confirmed the information stated by the scribe and made corrections and edits as appropriate.  I have personally provided the services documented by the scribe.      Jolene SchimkeGalwankar, Sagar C, MD        Jolene SchimkeGalwankar, Sagar C, MD  01/10/17 Silva [id number]       Jolene SchimkeGalwankar, Sagar C, MD  01/10/17 Ayesha [id number]       Jolene SchimkeGalwankar, Sagar C, MD  01/10/17 [id number]       Jolene SchimkeGalwankar, Sagar C, MD  01/10/17 2100

## 2017-01-11 NOTE — ED Provider Notes
be related to a completed abortion, a very early nonvisualized intrauterine pregnancy or a nonvisualized ectopic pregnancy. Further clinical correlation with serial quantitative beta hCG values is advised. Read By Burr Medico- Gregory Wynn M.D.  Electronically Verified By - Burr MedicoGregory Wynn M.D.  Released Date Time - 01/10/2017 5:35 PM  Resident -       EKG (Read by ED Provider):  not applicable        ED Course & Re-Evaluation     ED Course      26 y.o. female with h/o asthma presents with c/o abdominal pain x few days. Patient also notes two episodes of vomiting 2 days ago w/ "streaks of blood." Will order labs. IV fluids and Tylenol to be given. Will re-evaluate.  Jolene SchimkeGalwankar, Sagar C, MD 3:34 PM 01/10/2017       Pregnancy positive. OB US ordered.   Jolene SchimkeGalwankar, Sagar C, MD 4:17 PM 01/10/2017        Final findings and deduction:  No RUQ or RLQ tenderness  Tenderness mainly to midabdomen, uper epigastric region  White count nl  Lactic acid nl  Lipase nl  No gallstones on US  Prior h/o ectopic  D/W  with OB (Dr Lyn HollingsheadAlexander) and arrange f/u with her  Sat down and explained everything to the patient  Discussed that pathology is a dynamic process  I am only seeing her at one point in time  Advised to return if experiencing any worsening sxs in any way  Jolene SchimkeGalwankar, Sagar C, MD 6:24 PM 01/10/2017        MDM   Decide to obtain history from someone other than the patient: No    Decide to obtain previous medical records: No    Clinical Lab Test(s): Ordered and Reviewed    Diagnostic Tests (Radiology, EKG): Ordered and Reviewed    Independent Visualization (ED US, Wet Prep, Other): No    Discussed patient with NON-ED Provider: OB      ED Disposition   ED Disposition: No ED Disposition Set      ED Clinical Impression   ED Clinical Impression:   No Clinical Impression Set      ED Patient Status   Patient Status:   {SH ED JX PATIENT STATUS:[phone]}        ED Medical Evaluation Initiated   Medical Evaluation Initiated:

## 2017-01-11 NOTE — ED Provider Notes
intact distal pulses.    Pulmonary/Chest: Effort normal and breath sounds normal. No respiratory distress. She has no wheezes. She has no rales.   Abdominal: She exhibits no distension. There is tenderness in the epigastric area. There is no rebound and no guarding.   Genitourinary:   Genitourinary Comments: Permission taken from the patient prior to exam. Pelvic exam: Bimanual nl. Cultures taken. RN Ander Purpura present throughout exam.   Musculoskeletal: Normal range of motion. She exhibits no edema or tenderness.   Neurological: She is alert and oriented to person, place, and time. No cranial nerve deficit. Coordination normal.   Skin: Skin is warm and dry. She is not diaphoretic.   Psychiatric: She has a normal mood and affect. Her behavior is normal.   Nursing note and vitals reviewed.      Differential DDx: pregnancy, ectopic, GI bleed, other    Is this an Emergent Medical Condition? Yes - Severe Pain/Acute Onset of Symptons  409.901 FS  641.19 FS  627.732 (16) FS    ED Workup   Procedures    Labs:  -   COMPREHENSIVE METABOLIC PANEL - Abnormal        Result Value Ref Range    Sodium 137  135 - 145 mmol/L    Potassium 3.7  3.3 - 4.6 mmol/L    Chloride 101  101 - 110 mmol/L    CO2 26  21 - 29 mmol/L    Urea Nitrogen 4 (*) 6 - 22 mg/dL    Creatinine 0.84  0.51 - 0.95 mg/dL    BUN/Creatinine Ratio 4.8 (*) 6.0 - 22.0 (calc)    Glucose 125 (*) 71 - 99 mg/dL    Calcium 8.7  8.6 - 10.0 mg/dL    Total Protein 6.5  6.5 - 8.3 g/dL    Albumin 3.8  3.8 - 4.9 g/dL    Calc Total Globuin 2.7  gm/dL    ALBUMIN/GLOBULIN RATIO 1.4  (calc)    Total Bilirubin 0.3  0.2 - 1.0 mg/dL    Alkaline Phosphatase 55  35 - 104 IU/L    AST 13 (*) 14 - 33 IU/L    ALT 8 (*) 10 - 42 IU/L    Osmolality Calc 272.2      Anion Gap 10  4 - 16 mmol/L    EGFR >59  mL/min/1.73M2    Comment:   Reference range: =>90 ml/min/1.73M2  eGFR estimates are unable to accurately differentiate levels of GFR above 60 ml/min/1.73M2.   URINALYSIS W/MICROSCOPY - Abnormal

## 2017-01-11 NOTE — ED Provider Notes
BHCG guidelines.  These ranges have NOT been verified by [facility].    Weeks                         Weeks  Gestation   HCG mIU/mL        Gestation    HCB mIU/mL    3          5.8-71.2            10        [id number]    4          9.5-750             12        27832-210612    5          236 161 0529            14        13950-62530    6          158-31795           15        [id number]    7         [phone]          16         [phone]    8        [phone]          17         [phone]    9        [phone]          18         [phone]  Results may be affected by exogenous biotin consumption. Please consult the laboratory if an interference is suspected.   LACTIC ACID, PLASMA - Normal    LACTIC ACID 2.0  0.7 - 2.7 mmol/L   LIPASE - Normal    Lipase 28  0 - 60 U/L   WET PREP   CHLAMYDIA/ GONORRHEA DETECTIO*   CULTURE, URINE   RH ONLY    Rh Type Positive     CBC AND DIFFERENTIAL         Imaging (Read by ED Provider):  ***      EKG (Read by ED Provider):  not applicable        ED Course & Re-Evaluation     ED Course      26 y.o. female with h/o asthma presents with c/o abdominal pain x few days. Patient also notes two episodes of vomiting 2 days ago w/ "streaks of blood." Will order labs. IV fluids and Tylenol to be given. Will re-evaluate.  Jolene SchimkeGalwankar, Sagar C, MD 3:34 PM 01/10/2017       Pregnancy positive. OB US ordered.   Jolene SchimkeGalwankar, Sagar C, MD 4:17 PM 01/10/2017        ***      MDM   Decide to obtain history from someone other than the patient: No    Decide to obtain previous medical records: No    Clinical Lab Test(s): Ordered and Reviewed    Diagnostic Tests (Radiology, EKG): Ordered and Reviewed    Independent Visualization (ED US, Wet Prep, Other): No    Discussed patient with NON-ED Provider: {SH ED Lamonte SakaiJX MDM - ANOTHER JJOACZYS:06301}PROVIDER:28381}      ED Disposition   ED Disposition: No ED Disposition Set      ED Clinical Impression   ED Clinical Impression:  No Clinical Impression Set

## 2017-01-11 NOTE — ED Provider Notes
transvaginally but can be appreciated on the transabdominal portion of the examination. The left ovary is normal in appearance and normal in size measuring  2.9 x 1.5 x 2.3 cm. Doppler evaluation of the left ovary demonstrates both arterial and venous  flow. The right ovary is not visualized. No adnexal mass is seen. There is no free fluid within the cul-de-sac.     Impression: In light of the patient's clinical history the findings could be related to a completed abortion, a very early nonvisualized intrauterine pregnancy or a nonvisualized ectopic pregnancy. Further clinical correlation with serial quantitative beta hCG values is advised. Read By Burr Medico- Gregory Wynn M.D.  Electronically Verified By - Burr MedicoGregory Wynn M.D.  Released Date Time - 01/10/2017 5:35 PM  Resident -       EKG (Read by ED Provider):  not applicable        ED Course & Re-Evaluation     ED Course      26 y.o. female with h/o asthma presents with c/o abdominal pain x few days. Patient also notes two episodes of vomiting 2 days ago w/ "streaks of blood." Will order labs. IV fluids and Tylenol to be given. Will re-evaluate.  Jolene SchimkeGalwankar, Sagar C, MD 3:34 PM 01/10/2017       Pregnancy positive. OB US ordered.   Jolene SchimkeGalwankar, Sagar C, MD 4:17 PM 01/10/2017        Final findings and deduction:  No RUQ or RLQ tenderness  Tenderness mainly to midabdomen, uper epigastric region  Appendicitis remains a posisbility and pt refused admission / further work up / obs  White count nl; Lactic acid nl; Lipase nl; No gallstones on US  Prior h/o ectopic hence D/W  with OB (Dr Lyn HollingsheadAlexander) [facility]  [facility] Rx and Fu in Clinic  Sat down and explained everything to the patient; Discussed that pathology is a dynamic process; I am only seeing her at one point in time  Advised to return if experiencing any worsening sxs in any way  Closing Discussion done with Patient which includes Review of all Results

## 2017-01-11 NOTE — ED Provider Notes
per pt mother and father deceased from AIDS       Past Surgical History:   Procedure Laterality Date   ? CESAREAN SECTION     ? ECTOPIC PREGNANCY (ABDOMINAL) N/A 10/11/2013    ECTOPIC PREGNANCY (ABDOMINAL) performed by Valinda HoarBurnett, Erin Hale, MD at [facility]   ? LAPAROSCOPY ECTOPIC PREGNANCY N/A 10/11/2013    LAPAROSCOPY ECTOPIC PREGNANCY performed by Valinda HoarBurnett, Erin Hale, MD at [facility]       Family History   Problem Relation Age of Onset   ? Diabetes Other    ? High Blood Pressure Other    ? Diabetes Maternal Grandmother        Social History     Social History   ? Marital status: Single     Spouse name: N/A   ? Number of children: N/A   ? Years of education: N/A     Social History Main Topics   ? Smoking status: Never Smoker   ? Smokeless tobacco: Never Used   ? Alcohol use Yes      Comment: social   ? Drug use: No   ? Sexual activity: Yes     Partners: Male     Other Topics Concern   ? None     Social History Narrative   ? None       Review of Systems   Constitutional: Negative for fever and chills.   HENT: Negative for nasal congestion and sore throat.    Respiratory: Negative for cough and shortness of breath.    Cardiovascular: Negative for chest pain.   Gastrointestinal: Positive for nausea, vomiting and abdominal pain. Negative for diarrhea.   Genitourinary: Negative for dysuria, hematuria, vaginal bleeding and vaginal discharge.   Musculoskeletal: Negative for myalgias and arthralgias.   Skin: Negative for rash.   Neurological: Negative for weakness and light-headedness.   All other systems reviewed and are negative.      Physical Exam     ED Triage Vitals [01/10/17 1524]   BP 111/63   Pulse 91   Resp 16   Temp 36.8 ?C (98.2 ?F)   Temp src Oral   Height 1.6 m   Weight 100.6 kg   SpO2 100 %   BMI (Calculated) 39.37             Physical Exam   Constitutional: She is oriented to person, place, and time. She appears well-developed and well-nourished. No distress.   HENT:   Head: Normocephalic and atraumatic.

## 2017-01-11 NOTE — ED Provider Notes
Height 1.6 m   Weight 100.6 kg   SpO2 100 %   BMI (Calculated) 39.37             Physical Exam   Constitutional: She is oriented to person, place, and time. She appears well-developed and well-nourished. No distress.   HENT:   Head: Normocephalic and atraumatic.   Mouth/Throat: Oropharynx is clear and moist.   Eyes: Conjunctivae are normal. Pupils are equal, round, and reactive to light. Right eye exhibits no discharge. Left eye exhibits no discharge.   Neck: Normal range of motion. Neck supple.   Cardiovascular: Normal rate, regular rhythm, normal heart sounds and intact distal pulses.    Pulmonary/Chest: Effort normal and breath sounds normal. No respiratory distress. She has no wheezes. She has no rales.   Abdominal: She exhibits no distension. There is tenderness in the epigastric area. There is no rebound and no guarding.   Genitourinary:   Genitourinary Comments: Permission taken from the patient prior to exam. Pelvic exam: Bimanual nl. Cultures taken. RN Leotis ShamesLauren present throughout exam.   Musculoskeletal: Normal range of motion. She exhibits no edema or tenderness.   Neurological: She is alert and oriented to person, place, and time. No cranial nerve deficit. Coordination normal.   Skin: Skin is warm and dry. She is not diaphoretic.   Psychiatric: She has a normal mood and affect. Her behavior is normal.   Nursing note and vitals reviewed.      Differential DDx: pregnancy, ectopic, GI bleed, other    Is this an Emergent Medical Condition? Yes - Severe Pain/Acute Onset of Symptons  409.901 FS  641.19 FS  627.732 (16) FS    ED Workup   Procedures    Labs:  -   COMPREHENSIVE METABOLIC PANEL - Abnormal        Result Value Ref Range    Sodium 137  135 - 145 mmol/L    Potassium 3.7  3.3 - 4.6 mmol/L    Chloride 101  101 - 110 mmol/L    CO2 26  21 - 29 mmol/L    Urea Nitrogen 4 (*) 6 - 22 mg/dL    Creatinine 2.950.84  6.210.51 - 0.95 mg/dL    BUN/Creatinine Ratio 4.8 (*) 6.0 - 22.0 (calc)    Glucose 125 (*) 71 - 99 mg/dL

## 2017-01-11 NOTE — ED Provider Notes
Head: Normocephalic and atraumatic.   Mouth/Throat: Oropharynx is clear and moist.   Eyes: Conjunctivae are normal. Pupils are equal, round, and reactive to light. Right eye exhibits no discharge. Left eye exhibits no discharge.   Neck: Normal range of motion. Neck supple.   Cardiovascular: Normal rate, regular rhythm, normal heart sounds and intact distal pulses.    Pulmonary/Chest: Effort normal and breath sounds normal. No respiratory distress. She has no wheezes. She has no rales.   Abdominal: Soft. She exhibits no distension. There is generalized tenderness. There is no rebound and no guarding.   Genitourinary:   Genitourinary Comments: Permission taken from the patient prior to exam. Pelvic exam: Bimanual nl. Cultures taken. RN Leotis ShamesLauren present throughout exam.   Musculoskeletal: Normal range of motion. She exhibits no edema or tenderness.   Neurological: She is alert and oriented to person, place, and time. No cranial nerve deficit. Coordination normal.   Skin: Skin is warm and dry. She is not diaphoretic.   Psychiatric: She has a normal mood and affect. Her behavior is normal.   Nursing note and vitals reviewed.      Differential DDx: pregnancy, ectopic, GI bleed, other    Is this an Emergent Medical Condition? Yes - Severe Pain/Acute Onset of Symptons  409.901 FS  641.19 FS  627.732 (16) FS    ED Workup   Procedures    Labs:  -   COMPREHENSIVE METABOLIC PANEL - Abnormal        Result Value Ref Range    Sodium 137  135 - 145 mmol/L    Potassium 3.7  3.3 - 4.6 mmol/L    Chloride 101  101 - 110 mmol/L    CO2 26  21 - 29 mmol/L    Urea Nitrogen 4 (*) 6 - 22 mg/dL    Creatinine 5.400.84  9.810.51 - 0.95 mg/dL    BUN/Creatinine Ratio 4.8 (*) 6.0 - 22.0 (calc)    Glucose 125 (*) 71 - 99 mg/dL    Calcium 8.7  8.6 - 19.110.0 mg/dL    Total Protein 6.5  6.5 - 8.3 g/dL    Albumin 3.8  3.8 - 4.9 g/dL    Calc Total Globuin 2.7  gm/dL    ALBUMIN/GLOBULIN RATIO 1.4  (calc)    Total Bilirubin 0.3  0.2 - 1.0 mg/dL

## 2017-01-11 NOTE — ED Provider Notes
LAPAROSCOPY ECTOPIC PREGNANCY performed by Valinda HoarBurnett, Erin Hale, MD at [facility]       Family History   Problem Relation Age of Onset   ? Diabetes Other    ? High Blood Pressure Other    ? Diabetes Maternal Grandmother        Social History     Social History   ? Marital status: Single     Spouse name: N/A   ? Number of children: N/A   ? Years of education: N/A     Social History Main Topics   ? Smoking status: Never Smoker   ? Smokeless tobacco: Never Used   ? Alcohol use Yes      Comment: social   ? Drug use: No   ? Sexual activity: Yes     Partners: Male     Other Topics Concern   ? None     Social History Narrative   ? None       Review of Systems   Constitutional: Negative for fever and chills.   HENT: Negative for nasal congestion and sore throat.    Respiratory: Negative for cough and shortness of breath.    Cardiovascular: Negative for chest pain.   Gastrointestinal: Positive for nausea, vomiting and abdominal pain. Negative for diarrhea.   Genitourinary: Negative for dysuria, hematuria, vaginal bleeding and vaginal discharge.   Musculoskeletal: Negative for myalgias and arthralgias.   Skin: Negative for rash.   Neurological: Negative for weakness and light-headedness.   All other systems reviewed and are negative.      Physical Exam     ED Triage Vitals [01/10/17 1524]   BP 111/63   Pulse 91   Resp 16   Temp 36.8 ?C (98.2 ?F)   Temp src Oral   Height 1.6 m   Weight 100.6 kg   SpO2 100 %   BMI (Calculated) 39.37             Physical Exam   Constitutional: She is oriented to person, place, and time. She appears well-developed and well-nourished. No distress.   HENT:   Head: Normocephalic and atraumatic.   Mouth/Throat: Oropharynx is clear and moist.   Eyes: Conjunctivae are normal. Pupils are equal, round, and reactive to light. Right eye exhibits no discharge. Left eye exhibits no discharge.   Neck: Normal range of motion. Neck supple.   Cardiovascular: Normal rate, regular rhythm, normal heart sounds and

## 2017-01-11 NOTE — ED Provider Notes
measuring 7.7 centimeters. The main portal vein measures 0.70 centimeters in diameter and demonstrates hepatopedal flow. Only a portion of the pancreas (the body) is visualized but this appears normal in size, shape and echogenicity. The majority of the pancreas is obscured by overlying bowel gas. Both kidneys are normal in parenchymal echogenicity and there is no evidence of a renal mass, calculus or hydronephrosis. The kidneys are normal and relatively symmetrical in size with the right measuring 9.2 x 4.0 centimeters and the left measuring 9.5 x 5.1 centimeters. There is no abnormal fluid collection identified within the visualized portion of the abdomen. The superior aspects of the abdominal aorta and inferior vena cava appear within normal limits.     Impression: Limited visualization/evaluation of the pancreas as discussed above. Otherwise, an unremarkable examination. Read By Burr Medico- Gregory Wynn M.D.  Electronically Verified By - Burr MedicoGregory Wynn M.D.  Released Date Time - 01/10/2017 5:39 PM  Resident -     Per Radiology: [place] Ob Transvaginal Transabdominal <14 Wks    Result Date: 01/10/2017  Transabdominal and transvaginal pelvic sonography/early obstetrical sonography: Indication: Positive pregnancy test. Pelvic pain. Comparison: Pelvic sonography dated 12/10/2016. Findings: The uterus is within the limits of normal in size measuring approximately 10.8 x 3.8 x 4.7 cm. The endometrial echoes measure 5.6 mm. The uterus is homogeneous in echogenicity with no evidence of a gestational sac or other finding to suggest an intrauterine pregnancy. The left ovary is not visualized transvaginally but can be appreciated on the transabdominal portion of the examination. The left ovary is normal in appearance and normal in size measuring  2.9 x 1.5 x 2.3 cm. Doppler evaluation of the left ovary demonstrates both arterial and venous  flow. The right ovary is not visualized. No adnexal mass is seen. There is no free fluid within the

## 2017-01-11 NOTE — ED Provider Notes
Lymphocytes Absolute 1.66  x10E3/uL    Monocytes Absolute 0.39  x10E3/uL    Eosinophils Absolute 0.03  x10E3/uL    Basophil Absolute 0.02  x10E3/uL    Absolute Immature Granulocytes 0.02 (*) 0 - 0 x10E3/uL    Basophils % 0.4  0 - 1 %   HCG QUANTITATIVE BLOOD - Abnormal     HCG, BETA SUBUNIT, QUANT 62.8 (*) 0.0 - 5.0 mIU/mL    Comment:   The BHCG reagent manufacturer, [employer], publishes the following BHCG guidelines.  These ranges have NOT been verified by [facility].    Weeks                         Weeks  Gestation   HCG mIU/mL        Gestation    HCB mIU/mL    3          5.8-71.2            10        [id number]    4          9.5-750             12        27832-210612    5          [phone]            14        13950-62530    6          [phone]           15        [phone]    7         [phone]          16         [phone]    8        [phone]          17         [phone]    9        [phone]          18         [phone]  Results may be affected by exogenous biotin consumption. Please consult the laboratory if an interference is suspected.   LACTIC ACID, PLASMA - Normal    LACTIC ACID 2.0  0.7 - 2.7 mmol/L   LIPASE - Normal    Lipase 28  0 - 60 U/L   WET PREP    Trichomonas Vaginalis Negative for Trichomonas       YEAST Positive for Yeast     CHLAMYDIA/ GONORRHEA DETECTIO*   CULTURE, URINE   RH ONLY    Rh Type Positive     CBC AND DIFFERENTIAL         Imaging (Read by ED Provider):  Per Radiology: [place] Abdomen Complete    Result Date: 01/10/2017  Abdominal sonography: Indication: Abdominal pain. Technique: Multiple gray scale images of the abdomen were performed in the transverse and longitudinal planes. The gray scale images were supplemented with color Doppler interrogation. Findings: The gallbladder is normal in appearance with no evidence of a calculus, wall thickening, dilatation or other

## 2017-01-11 NOTE — ED Provider Notes
Comment:   Reference range: =>90 ml/min/1.73M2  eGFR estimates are unable to accurately differentiate levels of GFR above 60 ml/min/1.73M2.   URINALYSIS W/MICROSCOPY - Abnormal     Color -Ur Yellow  Hydrographic surveyor, UA Hazy  Hazy    Specific Gravity, Urine 1.014  1.003 - 1.030    pH, Urine 6.0  4.5 - 8.0    Protein-UA Negative  Negative mg/dL    Glucose -Ur Negative  Negative mg/dL    Ketones UA Negative  Negative mg/dL    Bilirubin -Ur Negative  Negative    Blood -Ur Negative  Negative    Nitrite -Ur Negative  Negative    Urobilinogen -Ur Normal  Normal    Leukocytes -Ur Moderate (*) Negative    RBC -Ur 3  0 - 5 /HPF    WBC -Ur 10 (*) 0 - 5 /HPF    Squam Epithel, UA 4  Not established /HPF    Bacteria -Ur Rare (*) None seen /HPF    Mucus -Ur Rare  2+ /LPF    ASCORBIC ACID Negative  20 mg/dL   HCG, QUALITATIVE, URINE - Abnormal     HCG-Urine Positive (*) Negative   CBC AUTODIFF - Abnormal     WBC 5.58  4.5 - 11 x10E3/uL    RBC 4.64  4.20 - 5.40 x10E6/uL    Hemoglobin 12.0  12.0 - 16.0 g/dL    Hematocrit 37.5  37.0 - 47.0 %    MCV 80.8 (*) 82.0 - 101.0 fl    MCH 25.9 (*) 27.0 - 34.0 pg    MCHC 32.0  31.0 - 36.0 g/dL    RDW 15.0  12.0 - 16.1 %    Platelet Count 325  140 - 440 thou/cu mm    MPV 8.8 (*) 9.5 - 11.5 fl    nRBC % 0.0  0.0 - 1.0 %    Absolute NRBC Count 0.00      Neutrophils % 62.0  34.0 - 73.0 %    Lymphocytes % 29.7  25.0 - 45.0 %    Monocytes % 7.0 (*) 2.0 - 6.0 %    Eosinophils % 0.5 (*) 1.0 - 4.0 %    Immature Granulocytes % 0.4  0.0 - 2.0 %    Neutrophils Absolute 3.46  1.80 - 8.70 x10E3/uL    Lymphocytes Absolute 1.66  x10E3/uL    Monocytes Absolute 0.39  x10E3/uL    Eosinophils Absolute 0.03  x10E3/uL    Basophil Absolute 0.02  x10E3/uL    Absolute Immature Granulocytes 0.02 (*) 0 - 0 x10E3/uL    Basophils % 0.4  0 - 1 %   HCG QUANTITATIVE BLOOD - Abnormal     HCG, BETA SUBUNIT, QUANT 62.8 (*) 0.0 - 5.0 mIU/mL    Comment:   The BHCG reagent manufacturer, [employer], publishes the following

## 2017-01-11 NOTE — ED Provider Notes
Head: Normocephalic and atraumatic.   Mouth/Throat: Oropharynx is clear and moist.   Eyes: Conjunctivae are normal. Pupils are equal, round, and reactive to light. Right eye exhibits no discharge. Left eye exhibits no discharge.   Neck: Normal range of motion. Neck supple.   Cardiovascular: Normal rate, regular rhythm, normal heart sounds and intact distal pulses.    Pulmonary/Chest: Effort normal and breath sounds normal. No respiratory distress. She has no wheezes. She has no rales.   Abdominal: Soft. She exhibits no distension. There is generalized tenderness. There is no rebound and no guarding.   Musculoskeletal: Normal range of motion. She exhibits no edema or tenderness.   Neurological: She is alert and oriented to person, place, and time. No cranial nerve deficit. Coordination normal.   Skin: Skin is warm and dry. She is not diaphoretic.   Psychiatric: She has a normal mood and affect. Her behavior is normal.   Nursing note and vitals reviewed.      Differential DDx: pregnancy, ectopic, GI bleed, other    Is this an Emergent Medical Condition? Yes - Severe Pain/Acute Onset of Symptons  409.901 FS  641.19 FS  627.732 (16) FS    ED Workup   Procedures    Labs:  -   HCG, QUALITATIVE, URINE - Abnormal        Result Value Ref Range    HCG-Urine Positive (*) Negative   CBC AUTODIFF - Abnormal     WBC 5.58  4.5 - 11 x10E3/uL    RBC 4.64  4.20 - 5.40 x10E6/uL    Hemoglobin 12.0  12.0 - 16.0 g/dL    Hematocrit 16.137.5  09.637.0 - 47.0 %    MCV 80.8 (*) 82.0 - 101.0 fl    MCH 25.9 (*) 27.0 - 34.0 pg    MCHC 32.0  31.0 - 36.0 g/dL    RDW 04.515.0  40.912.0 - 81.116.1 %    Platelet Count 325  140 - 440 thou/cu mm    MPV 8.8 (*) 9.5 - 11.5 fl    nRBC % 0.0  0.0 - 1.0 %    Absolute NRBC Count 0.00      Neutrophils % 62.0  34.0 - 73.0 %    Lymphocytes % 29.7  25.0 - 45.0 %    Monocytes % 7.0 (*) 2.0 - 6.0 %    Eosinophils % 0.5 (*) 1.0 - 4.0 %    Immature Granulocytes % 0.4  0.0 - 2.0 %    Neutrophils Absolute 3.46  1.80 - 8.70 x10E3/uL

## 2017-01-11 NOTE — ED Provider Notes
Head: Normocephalic and atraumatic.   Mouth/Throat: Oropharynx is clear and moist.   Eyes: Conjunctivae are normal. Pupils are equal, round, and reactive to light. Right eye exhibits no discharge. Left eye exhibits no discharge.   Neck: Normal range of motion. Neck supple.   Cardiovascular: Normal rate, regular rhythm, normal heart sounds and intact distal pulses.    Pulmonary/Chest: Effort normal and breath sounds normal. No respiratory distress. She has no wheezes. She has no rales.   Abdominal: Soft. She exhibits no distension. There is generalized tenderness. There is no rebound and no guarding.   Musculoskeletal: Normal range of motion. She exhibits no edema or tenderness.   Neurological: She is alert and oriented to person, place, and time. No cranial nerve deficit. Coordination normal.   Skin: Skin is warm and dry. She is not diaphoretic.   Psychiatric: She has a normal mood and affect. Her behavior is normal.   Nursing note and vitals reviewed.      Differential DDx: pregnancy, ectopic, GI bleed, other    Is this an Emergent Medical Condition? Yes - Severe Pain/Acute Onset of Symptons  409.901 FS  641.19 FS  627.732 (16) FS    ED Workup   Procedures    Labs:  -   COMPREHENSIVE METABOLIC PANEL - Abnormal        Result Value Ref Range    Sodium 137  135 - 145 mmol/L    Potassium 3.7  3.3 - 4.6 mmol/L    Chloride 101  101 - 110 mmol/L    CO2 26  21 - 29 mmol/L    Urea Nitrogen 4 (*) 6 - 22 mg/dL    Creatinine 0.84  0.51 - 0.95 mg/dL    BUN/Creatinine Ratio 4.8 (*) 6.0 - 22.0 (calc)    Glucose 125 (*) 71 - 99 mg/dL    Calcium 8.7  8.6 - 10.0 mg/dL    Total Protein 6.5  6.5 - 8.3 g/dL    Albumin 3.8  3.8 - 4.9 g/dL    Calc Total Globuin 2.7  gm/dL    ALBUMIN/GLOBULIN RATIO 1.4  (calc)    Total Bilirubin 0.3  0.2 - 1.0 mg/dL    Alkaline Phosphatase 55  35 - 104 IU/L    AST 13 (*) 14 - 33 IU/L    ALT 8 (*) 10 - 42 IU/L    Osmolality Calc 272.2      Anion Gap 10  4 - 16 mmol/L    EGFR >59  mL/min/1.73M2

## 2017-01-11 NOTE — ED Provider Notes
Eyes: Conjunctivae are normal. Pupils are equal, round, and reactive to light. Right eye exhibits no discharge. Left eye exhibits no discharge.   Neck: Normal range of motion. Neck supple.   Cardiovascular: Normal rate, regular rhythm, normal heart sounds and intact distal pulses.    Pulmonary/Chest: Effort normal and breath sounds normal. No respiratory distress. She has no wheezes. She has no rales.   Abdominal: Soft. She exhibits no distension. There is generalized tenderness. There is no rebound and no guarding.   Genitourinary:   Genitourinary Comments: Permission taken from the patient prior to exam. Pelvic exam: Bimanual nl. Cultures taken. RN Leotis ShamesLauren present throughout exam.   Musculoskeletal: Normal range of motion. She exhibits no edema or tenderness.   Neurological: She is alert and oriented to person, place, and time. No cranial nerve deficit. Coordination normal.   Skin: Skin is warm and dry. She is not diaphoretic.   Psychiatric: She has a normal mood and affect. Her behavior is normal.   Nursing note and vitals reviewed.      Differential DDx: pregnancy, ectopic, GI bleed, other    Is this an Emergent Medical Condition? Yes - Severe Pain/Acute Onset of Symptons  409.901 FS  641.19 FS  627.732 (16) FS    ED Workup   Procedures    Labs:  -   COMPREHENSIVE METABOLIC PANEL - Abnormal        Result Value Ref Range    Sodium 137  135 - 145 mmol/L    Potassium 3.7  3.3 - 4.6 mmol/L    Chloride 101  101 - 110 mmol/L    CO2 26  21 - 29 mmol/L    Urea Nitrogen 4 (*) 6 - 22 mg/dL    Creatinine 1.610.84  0.960.51 - 0.95 mg/dL    BUN/Creatinine Ratio 4.8 (*) 6.0 - 22.0 (calc)    Glucose 125 (*) 71 - 99 mg/dL    Calcium 8.7  8.6 - 04.510.0 mg/dL    Total Protein 6.5  6.5 - 8.3 g/dL    Albumin 3.8  3.8 - 4.9 g/dL    Calc Total Globuin 2.7  gm/dL    ALBUMIN/GLOBULIN RATIO 1.4  (calc)    Total Bilirubin 0.3  0.2 - 1.0 mg/dL    Alkaline Phosphatase 55  35 - 104 IU/L    AST 13 (*) 14 - 33 IU/L    ALT 8 (*) 10 - 42 IU/L

## 2017-01-11 NOTE — ED Provider Notes
Calcium 8.7  8.6 - 10.0 mg/dL    Total Protein 6.5  6.5 - 8.3 g/dL    Albumin 3.8  3.8 - 4.9 g/dL    Calc Total Globuin 2.7  gm/dL    ALBUMIN/GLOBULIN RATIO 1.4  (calc)    Total Bilirubin 0.3  0.2 - 1.0 mg/dL    Alkaline Phosphatase 55  35 - 104 IU/L    AST 13 (*) 14 - 33 IU/L    ALT 8 (*) 10 - 42 IU/L    Osmolality Calc 272.2      Anion Gap 10  4 - 16 mmol/L    EGFR >59  mL/min/1.73M2    Comment:   Reference range: =>90 ml/min/1.73M2  eGFR estimates are unable to accurately differentiate levels of GFR above 60 ml/min/1.73M2.   URINALYSIS W/MICROSCOPY - Abnormal     Color -Ur Yellow  Amber    Clarity, UA Hazy  Hazy    Specific Gravity, Urine 1.014  1.003 - 1.030    pH, Urine 6.0  4.5 - 8.0    Protein-UA Negative  Negative mg/dL    Glucose -Ur Negative  Negative mg/dL    Ketones UA Negative  Negative mg/dL    Bilirubin -Ur Negative  Negative    Blood -Ur Negative  Negative    Nitrite -Ur Negative  Negative    Urobilinogen -Ur Normal  Normal    Leukocytes -Ur Moderate (*) Negative    RBC -Ur 3  0 - 5 /HPF    WBC -Ur 10 (*) 0 - 5 /HPF    Squam Epithel, UA 4  Not established /HPF    Bacteria -Ur Rare (*) None seen /HPF    Mucus -Ur Rare  2+ /LPF    ASCORBIC ACID Negative  20 mg/dL   HCG, QUALITATIVE, URINE - Abnormal     HCG-Urine Positive (*) Negative   CBC AUTODIFF - Abnormal     WBC 5.58  4.5 - 11 x10E3/uL    RBC 4.64  4.20 - 5.40 x10E6/uL    Hemoglobin 12.0  12.0 - 16.0 g/dL    Hematocrit 37.5  37.0 - 47.0 %    MCV 80.8 (*) 82.0 - 101.0 fl    MCH 25.9 (*) 27.0 - 34.0 pg    MCHC 32.0  31.0 - 36.0 g/dL    RDW 15.0  12.0 - 16.1 %    Platelet Count 325  140 - 440 thou/cu mm    MPV 8.8 (*) 9.5 - 11.5 fl    nRBC % 0.0  0.0 - 1.0 %    Absolute NRBC Count 0.00      Neutrophils % 62.0  34.0 - 73.0 %    Lymphocytes % 29.7  25.0 - 45.0 %    Monocytes % 7.0 (*) 2.0 - 6.0 %    Eosinophils % 0.5 (*) 1.0 - 4.0 %    Immature Granulocytes % 0.4  0.0 - 2.0 %    Neutrophils Absolute 3.46  1.80 - 8.70 x10E3/uL

## 2017-01-11 NOTE — ED Provider Notes
Mouth/Throat: Oropharynx is clear and moist.   Eyes: Conjunctivae are normal. Pupils are equal, round, and reactive to light. Right eye exhibits no discharge. Left eye exhibits no discharge.   Neck: Normal range of motion. Neck supple.   Cardiovascular: Normal rate, regular rhythm, normal heart sounds and intact distal pulses.    Pulmonary/Chest: Effort normal and breath sounds normal. No respiratory distress. She has no wheezes. She has no rales.   Abdominal: She exhibits no distension. There is tenderness in the epigastric area. There is no rebound and no guarding.   Genitourinary:   Genitourinary Comments: Permission taken from the patient prior to exam. Pelvic exam: Bimanual nl. Cultures taken. RN Leotis ShamesLauren present throughout exam.   Musculoskeletal: Normal range of motion. She exhibits no edema or tenderness.   Neurological: She is alert and oriented to person, place, and time. No cranial nerve deficit. Coordination normal.   Skin: Skin is warm and dry. She is not diaphoretic.   Psychiatric: She has a normal mood and affect. Her behavior is normal.   Nursing note and vitals reviewed.      Differential DDx: pregnancy, ectopic, GI bleed, other    Is this an Emergent Medical Condition? Yes - Severe Pain/Acute Onset of Symptons  409.901 FS  641.19 FS  627.732 (16) FS    ED Workup   Procedures    Labs:  -   COMPREHENSIVE METABOLIC PANEL - Abnormal        Result Value Ref Range    Sodium 137  135 - 145 mmol/L    Potassium 3.7  3.3 - 4.6 mmol/L    Chloride 101  101 - 110 mmol/L    CO2 26  21 - 29 mmol/L    Urea Nitrogen 4 (*) 6 - 22 mg/dL    Creatinine 1.610.84  0.960.51 - 0.95 mg/dL    BUN/Creatinine Ratio 4.8 (*) 6.0 - 22.0 (calc)    Glucose 125 (*) 71 - 99 mg/dL    Calcium 8.7  8.6 - 04.510.0 mg/dL    Total Protein 6.5  6.5 - 8.3 g/dL    Albumin 3.8  3.8 - 4.9 g/dL    Calc Total Globuin 2.7  gm/dL    ALBUMIN/GLOBULIN RATIO 1.4  (calc)    Total Bilirubin 0.3  0.2 - 1.0 mg/dL    Alkaline Phosphatase 55  35 - 104 IU/L

## 2017-01-11 NOTE — ED Provider Notes
in diameter and demonstrates hepatopedal flow. Only a portion of the pancreas (the body) is visualized but this appears normal in size, shape and echogenicity. The majority of the pancreas is obscured by overlying bowel gas. Both kidneys are normal in parenchymal echogenicity and there is no evidence of a renal mass, calculus or hydronephrosis. The kidneys are normal and relatively symmetrical in size with the right measuring 9.2 x 4.0 centimeters and the left measuring 9.5 x 5.1 centimeters. There is no abnormal fluid collection identified within the visualized portion of the abdomen. The superior aspects of the abdominal aorta and inferior vena cava appear within normal limits.     Impression: Limited visualization/evaluation of the pancreas as discussed above. Otherwise, an unremarkable examination. Read By Burr Medico- Gregory Wynn M.D.  Electronically Verified By - Burr MedicoGregory Wynn M.D.  Released Date Time - 01/10/2017 5:39 PM  Resident -     Per Radiology: [place] Ob Transvaginal Transabdominal <14 Wks    Result Date: 01/10/2017  Transabdominal and transvaginal pelvic sonography/early obstetrical sonography: Indication: Positive pregnancy test. Pelvic pain. Comparison: Pelvic sonography dated 12/10/2016. Findings: The uterus is within the limits of normal in size measuring approximately 10.8 x 3.8 x 4.7 cm. The endometrial echoes measure 5.6 mm. The uterus is homogeneous in echogenicity with no evidence of a gestational sac or other finding to suggest an intrauterine pregnancy. The left ovary is not visualized transvaginally but can be appreciated on the transabdominal portion of the examination. The left ovary is normal in appearance and normal in size measuring  2.9 x 1.5 x 2.3 cm. Doppler evaluation of the left ovary demonstrates both arterial and venous  flow. The right ovary is not visualized. No adnexal mass is seen. There is no free fluid within the cul-de-sac.

## 2017-01-11 NOTE — ED Provider Notes
HCG, BETA SUBUNIT, QUANT 62.8 (*) 0.0 - 5.0 mIU/mL    Comment:   The BHCG reagent manufacturer, [employer], publishes the following BHCG guidelines.  These ranges have NOT been verified by [facility].    Weeks                         Weeks  Gestation   HCG mIU/mL        Gestation    HCB mIU/mL    3          5.8-71.2            10        [id number]    4          9.5-750             12        27832-210612    5          [phone]            14        13950-62530    6          [phone]           15        [phone]    7         [phone]          16         [phone]    8        [phone]          17         [phone]    9        [phone]          18         [phone]  Results may be affected by exogenous biotin consumption. Please consult the laboratory if an interference is suspected.   LACTIC ACID, PLASMA - Normal    LACTIC ACID 2.0  0.7 - 2.7 mmol/L   LIPASE - Normal    Lipase 28  0 - 60 U/L   WET PREP   CHLAMYDIA/ GONORRHEA DETECTIO*   CULTURE, URINE   RH ONLY    Rh Type Positive     CBC AND DIFFERENTIAL         Imaging (Read by ED Provider):  Per Radiology: [place] Abdomen Complete    Result Date: 01/10/2017  Abdominal sonography: Indication: Abdominal pain. Technique: Multiple gray scale images of the abdomen were performed in the transverse and longitudinal planes. The gray scale images were supplemented with color Doppler interrogation. Findings: The gallbladder is normal in appearance with no evidence of a calculus, wall thickening, dilatation or other abnormality. There is no intra or extrahepatic biliary ductal dilatation with the common duct measuring 2.6 millimeters. The liver and spleen are both homogeneous and normal in echogenicity and normal in size with the liver measuring 15.5 centimeters in cephalocaudal dimension and the spleen measuring 7.7 centimeters. The main portal vein measures 0.70 centimeters

## 2017-01-11 NOTE — ED Provider Notes
Alkaline Phosphatase 55  35 - 104 IU/L    AST 13 (*) 14 - 33 IU/L    ALT 8 (*) 10 - 42 IU/L    Osmolality Calc 272.2      Anion Gap 10  4 - 16 mmol/L    EGFR >59  mL/min/1.73M2    Comment:   Reference range: =>90 ml/min/1.73M2  eGFR estimates are unable to accurately differentiate levels of GFR above 60 ml/min/1.73M2.   URINALYSIS W/MICROSCOPY - Abnormal     Color -Ur Yellow  Amber    Clarity, UA Hazy  Hazy    Specific Gravity, Urine 1.014  1.003 - 1.030    pH, Urine 6.0  4.5 - 8.0    Protein-UA Negative  Negative mg/dL    Glucose -Ur Negative  Negative mg/dL    Ketones UA Negative  Negative mg/dL    Bilirubin -Ur Negative  Negative    Blood -Ur Negative  Negative    Nitrite -Ur Negative  Negative    Urobilinogen -Ur Normal  Normal    Leukocytes -Ur Moderate (*) Negative    RBC -Ur 3  0 - 5 /HPF    WBC -Ur 10 (*) 0 - 5 /HPF    Squam Epithel, UA 4  Not established /HPF    Bacteria -Ur Rare (*) None seen /HPF    Mucus -Ur Rare  2+ /LPF    ASCORBIC ACID Negative  20 mg/dL   HCG, QUALITATIVE, URINE - Abnormal     HCG-Urine Positive (*) Negative   CBC AUTODIFF - Abnormal     WBC 5.58  4.5 - 11 x10E3/uL    RBC 4.64  4.20 - 5.40 x10E6/uL    Hemoglobin 12.0  12.0 - 16.0 g/dL    Hematocrit 37.5  37.0 - 47.0 %    MCV 80.8 (*) 82.0 - 101.0 fl    MCH 25.9 (*) 27.0 - 34.0 pg    MCHC 32.0  31.0 - 36.0 g/dL    RDW 15.0  12.0 - 16.1 %    Platelet Count 325  140 - 440 thou/cu mm    MPV 8.8 (*) 9.5 - 11.5 fl    nRBC % 0.0  0.0 - 1.0 %    Absolute NRBC Count 0.00      Neutrophils % 62.0  34.0 - 73.0 %    Lymphocytes % 29.7  25.0 - 45.0 %    Monocytes % 7.0 (*) 2.0 - 6.0 %    Eosinophils % 0.5 (*) 1.0 - 4.0 %    Immature Granulocytes % 0.4  0.0 - 2.0 %    Neutrophils Absolute 3.46  1.80 - 8.70 x10E3/uL    Lymphocytes Absolute 1.66  x10E3/uL    Monocytes Absolute 0.39  x10E3/uL    Eosinophils Absolute 0.03  x10E3/uL    Basophil Absolute 0.02  x10E3/uL    Absolute Immature Granulocytes 0.02 (*) 0 - 0 x10E3/uL

## 2017-01-11 NOTE — ED Provider Notes
and Explnation of the POA which patient/ parents/ partners have understood  Tammy Burke, Tammy BunkerSagar C, MD 6:24 PM 01/10/2017        MDM   Decide to obtain history from someone other than the patient: No    Decide to obtain previous medical records: No    Clinical Lab Test(s): Ordered and Reviewed    Diagnostic Tests (Radiology, EKG): Ordered and Reviewed    Independent Visualization (ED US, Wet Prep, Other): No    Discussed patient with NON-ED Provider: OB      ED Disposition   ED Disposition: Discharge      ED Clinical Impression   ED Clinical Impression:   Abdominal pain, unspecified abdominal location  Pregnancy as incidental finding      ED Patient Status   Patient Status:   Good        ED Medical Evaluation Initiated   Medical Evaluation Initiated:  Yes, filed at 01/10/17 1502  by Jolene SchimkeGalwankar, Sagar C, MD            Jolene SchimkeGalwankar, Sagar C, MD  01/10/17 1502    Scribe Attestation: Delsa GranaI, Mikel Cress, have acted as a Neurosurgeonscribe for Jolene SchimkeGalwankar, Sagar C, MD 3:03 PM 01/10/2017     Physician Attestation: I have reviewed and confirmed the information stated by the scribe and made corrections and edits as appropriate.  I have personally provided the services documented by the scribe.      Jolene SchimkeGalwankar, Sagar C, MD        Jolene SchimkeGalwankar, Sagar C, MD  01/10/17 Silva [id number]       Jolene SchimkeGalwankar, Sagar C, MD  01/10/17 Ayesha [id number]       Jolene SchimkeGalwankar, Sagar C, MD  01/10/17 [phone]

## 2017-01-11 NOTE — ED Provider Notes
Color -Ur Yellow  Insurance risk surveyorAmber    Clarity, UA Hazy  Hazy    Specific Gravity, Urine 1.014  1.003 - 1.030    pH, Urine 6.0  4.5 - 8.0    Protein-UA Negative  Negative mg/dL    Glucose -Ur Negative  Negative mg/dL    Ketones UA Negative  Negative mg/dL    Bilirubin -Ur Negative  Negative    Blood -Ur Negative  Negative    Nitrite -Ur Negative  Negative    Urobilinogen -Ur Normal  Normal    Leukocytes -Ur Moderate (*) Negative    RBC -Ur 3  0 - 5 /HPF    WBC -Ur 10 (*) 0 - 5 /HPF    Squam Epithel, UA 4  Not established /HPF    Bacteria -Ur Rare (*) None seen /HPF    Mucus -Ur Rare  2+ /LPF    ASCORBIC ACID Negative  20 mg/dL   HCG, QUALITATIVE, URINE - Abnormal     HCG-Urine Positive (*) Negative   CBC AUTODIFF - Abnormal     WBC 5.58  4.5 - 11 x10E3/uL    RBC 4.64  4.20 - 5.40 x10E6/uL    Hemoglobin 12.0  12.0 - 16.0 g/dL    Hematocrit 56.237.5  13.037.0 - 47.0 %    MCV 80.8 (*) 82.0 - 101.0 fl    MCH 25.9 (*) 27.0 - 34.0 pg    MCHC 32.0  31.0 - 36.0 g/dL    RDW 86.515.0  78.412.0 - 69.616.1 %    Platelet Count 325  140 - 440 thou/cu mm    MPV 8.8 (*) 9.5 - 11.5 fl    nRBC % 0.0  0.0 - 1.0 %    Absolute NRBC Count 0.00      Neutrophils % 62.0  34.0 - 73.0 %    Lymphocytes % 29.7  25.0 - 45.0 %    Monocytes % 7.0 (*) 2.0 - 6.0 %    Eosinophils % 0.5 (*) 1.0 - 4.0 %    Immature Granulocytes % 0.4  0.0 - 2.0 %    Neutrophils Absolute 3.46  1.80 - 8.70 x10E3/uL    Lymphocytes Absolute 1.66  x10E3/uL    Monocytes Absolute 0.39  x10E3/uL    Eosinophils Absolute 0.03  x10E3/uL    Basophil Absolute 0.02  x10E3/uL    Absolute Immature Granulocytes 0.02 (*) 0 - 0 x10E3/uL    Basophils % 0.4  0 - 1 %   HCG QUANTITATIVE BLOOD - Abnormal     HCG, BETA SUBUNIT, QUANT 62.8 (*) 0.0 - 5.0 mIU/mL    Comment:   The BHCG reagent manufacturer, [employer], publishes the following BHCG guidelines.  These ranges have NOT been verified by [facility].    Weeks                         Weeks

## 2017-01-11 NOTE — ED Provider Notes
History     Chief Complaint   Patient presents with   ? Abdominal Pain       26 y.o. female with h/o asthma presents with c/o abdominal pain x few days.  Pain is localized to epigastric. Patient also notes two episodes of vomiting 2 days ago Denies any diarrhea or hematochezia. Denies any concern for pregnancy. LMP: end of April. Prior h/o ectopic pregnancy. SHx: C-section.      The history is provided by the patient.   Abdominal Pain   Pain location:  Generalized  Pain radiates to:  Does not radiate  Pain severity:  Moderate  Onset quality:  Gradual  Timing:  Constant  Progression:  Unchanged  Chronicity:  New  Relieved by:  Nothing  Worsened by:  Nothing  Ineffective treatments:  None tried  Associated symptoms: nausea and vomiting    Associated symptoms: no chest pain, no chills, no cough, no diarrhea, no dysuria, no fever, no hematuria, no shortness of breath, no sore throat, no vaginal bleeding and no vaginal discharge        Allergies   Allergen Reactions   ? No Known Allergies      No Known Allergies       Discharge Medication List as of 01/10/2017  6:33 PM      START taking these medications    Details   nitrofurantoin (macrocrystalline) (MACROBID) 100 MG PO Capsule Take 1 capsule by mouth 2 times daily for 10 days.Disp-20 capsule, R-0, Normal      PRENATAL MULTI +DHA 27-0.8-228 MG PO Capsule Take 1 capsule by mouth daily.Disp-15 capsule, R-0, Normal             Past Medical History:   Diagnosis Date   ? Asthma    ? Chlamydia infection complicating pregnancy     03/20/11   ? Gonorrhea complicating pregnancy     03/20/11   ? HIV exposure     per pt mother and father deceased from AIDS       Past Surgical History:   Procedure Laterality Date   ? CESAREAN SECTION     ? ECTOPIC PREGNANCY (ABDOMINAL) N/A 10/11/2013    ECTOPIC PREGNANCY (ABDOMINAL) performed by Valinda HoarBurnett, Erin Hale, MD at [facility]   ? LAPAROSCOPY ECTOPIC PREGNANCY N/A 10/11/2013

## 2017-01-11 NOTE — ED Provider Notes
History     Chief Complaint   Patient presents with   ? Abdominal Pain       26 y.o. female with h/o asthma presents with c/o abdominal pain x few days.  Pain is localized to epigastric. Patient also notes two episodes of vomiting 2 days ago Denies any diarrhea or hematochezia. Denies any concern for pregnancy. LMP: end of April. Prior h/o ectopic pregnancy. SHx: C-section.      The history is provided by the patient.   Abdominal Pain   Pain location:  Generalized  Pain radiates to:  Does not radiate  Pain severity:  Moderate  Onset quality:  Gradual  Timing:  Constant  Progression:  Unchanged  Chronicity:  New  Relieved by:  Nothing  Worsened by:  Nothing  Ineffective treatments:  None tried  Associated symptoms: nausea and vomiting    Associated symptoms: no chest pain, no chills, no cough, no diarrhea, no dysuria, no fever, no hematuria, no shortness of breath, no sore throat, no vaginal bleeding and no vaginal discharge        Allergies   Allergen Reactions   ? No Known Allergies      No Known Allergies       Patient's Medications   New Prescriptions    NITROFURANTOIN (MACROCRYSTALLINE) (MACROBID) 100 MG PO CAPSULE    Take 1 capsule by mouth 2 times daily for 10 days.    PRENATAL MULTI +DHA 27-0.8-228 MG PO CAPSULE    Take 1 capsule by mouth daily.   Previous Medications    No medications on file   Modified Medications    No medications on file   Discontinued Medications    ALBUTEROL (PROAIR HFA;VENTOLIN HFA) 108 (90 BASE) MCG/ACT AEROSOL SOLUTION    Inhale 2 puffs every 6 hours as needed for wheezing (Dispense with spacer and teach how to use).    IBUPROFEN (ADVIL,MOTRIN) 800 MG PO TABLET    Take 1 tablet by mouth every 8 hours as needed for pain.    NITROFURANTOIN, MACROCRYSTALLINE, (MACROBID) 100 MG CAPSULE    Take 1 capsule by mouth 2 times daily for 7 days.    ONDANSETRON (ZOFRAN-ODT) 4 MG TABLET DISPERSIBLE    Dissolve 1 tablet in mouth every 8 hours as needed for nausea or vomiting.

## 2017-01-11 NOTE — ED Provider Notes
Discussed patient with NON-ED Provider: OB      ED Disposition   ED Disposition: Discharge      ED Clinical Impression   ED Clinical Impression:   Abdominal pain, unspecified abdominal location  Pregnancy as incidental finding      ED Patient Status   Patient Status:   Good        ED Medical Evaluation Initiated   Medical Evaluation Initiated:  Yes, filed at 01/10/17 1502  by Jolene SchimkeGalwankar, Sagar C, MD            Jolene SchimkeGalwankar, Sagar C, MD  01/10/17 1502    Scribe Attestation: Delsa GranaI, Mikel Cress, have acted as a Neurosurgeonscribe for Jolene SchimkeGalwankar, Sagar C, MD 3:03 PM 01/10/2017     Physician Attestation: I have reviewed and confirmed the information stated by the scribe and made corrections and edits as appropriate.  I have personally provided the services documented by the scribe.      Jolene SchimkeSagar C Galwankar, MD          Jolene SchimkeGalwankar, Sagar C, MD  01/10/17 Silva [id number]       Jolene SchimkeGalwankar, Sagar C, MD  01/10/17 Ayesha [id number]       Jolene SchimkeGalwankar, Sagar C, MD  01/10/17 [phone]

## 2017-01-11 NOTE — ED Provider Notes
ED Disposition   ED Disposition: Discharge      ED Clinical Impression   ED Clinical Impression:   Abdominal pain, unspecified abdominal location  Pregnancy as incidental finding      ED Patient Status   Patient Status:   Good        ED Medical Evaluation Initiated   Medical Evaluation Initiated:  Yes, filed at 01/10/17 1502  by Jolene SchimkeGalwankar, Sagar C, MD            Jolene SchimkeGalwankar, Sagar C, MD  01/10/17 1502    Scribe Attestation: Delsa GranaI, Mikel Cress, have acted as a Neurosurgeonscribe for Jolene SchimkeGalwankar, Sagar C, MD 3:03 PM 01/10/2017     Physician Attestation: I have reviewed and confirmed the information stated by the scribe and made corrections and edits as appropriate.  I have personally provided the services documented by the scribe.      Jolene SchimkeSagar C Galwankar, MD          Jolene SchimkeGalwankar, Sagar C, MD  01/10/17 Silva [id number]       Jolene SchimkeGalwankar, Sagar C, MD  01/10/17 [phone]

## 2017-01-11 NOTE — ED Provider Notes
History     Chief Complaint   Patient presents with   ? Abdominal Pain       26 y.o. female with h/o asthma presents with c/o abdominal pain x few days.  Pain is localized to epigastric. Patient also notes two episodes of vomiting 2 days ago Denies any diarrhea or hematochezia. Denies any concern for pregnancy. LMP: end of April. Prior h/o ectopic pregnancy. SHx: C-section.      The history is provided by the patient.   Abdominal Pain   Pain location:  Generalized  Pain radiates to:  Does not radiate  Pain severity:  Moderate  Onset quality:  Gradual  Timing:  Constant  Progression:  Unchanged  Chronicity:  New  Relieved by:  Nothing  Worsened by:  Nothing  Ineffective treatments:  None tried  Associated symptoms: nausea and vomiting    Associated symptoms: no chest pain, no chills, no cough, no diarrhea, no dysuria, no fever, no hematuria, no shortness of breath, no sore throat, no vaginal bleeding and no vaginal discharge        Allergies   Allergen Reactions   ? No Known Allergies      No Known Allergies       Patient's Medications   New Prescriptions    No medications on file   Previous Medications    No medications on file   Modified Medications    No medications on file   Discontinued Medications    ALBUTEROL (PROAIR HFA;VENTOLIN HFA) 108 (90 BASE) MCG/ACT AEROSOL SOLUTION    Inhale 2 puffs every 6 hours as needed for wheezing (Dispense with spacer and teach how to use).    IBUPROFEN (ADVIL,MOTRIN) 800 MG PO TABLET    Take 1 tablet by mouth every 8 hours as needed for pain.    ONDANSETRON (ZOFRAN-ODT) 4 MG TABLET DISPERSIBLE    Dissolve 1 tablet in mouth every 8 hours as needed for nausea or vomiting.    TRAMADOL (ULTRAM) 50 MG PO TABLET    1-2 tabs every 6 hours prn pain       Past Medical History:   Diagnosis Date   ? Asthma    ? Chlamydia infection complicating pregnancy     03/20/11   ? Gonorrhea complicating pregnancy     03/20/11   ? HIV exposure     per pt mother and father deceased from AIDS

## 2017-01-11 NOTE — ED Provider Notes
clinical correlation with serial quantitative beta hCG values is advised. Read By Burr Medico- Gregory Wynn M.D.  Electronically Verified By - Burr MedicoGregory Wynn M.D.  Released Date Time - 01/10/2017 5:35 PM  Resident -       EKG (Read by ED Provider):  not applicable        ED Course & Re-Evaluation     ED Course      26 y.o. female with h/o asthma presents with c/o abdominal pain x few days. Patient also notes two episodes of vomiting 2 days ago w/ "streaks of blood." Will order labs. IV fluids and Tylenol to be given. Will re-evaluate.  Jolene SchimkeGalwankar, Sagar C, MD 3:34 PM 01/10/2017       Pregnancy positive. OB US ordered.   Jolene SchimkeGalwankar, Sagar C, MD 4:17 PM 01/10/2017        Final findings and deduction:  No RUQ or RLQ tenderness  Tenderness mainly to midabdomen, uper epigastric region  Appendicitis remains a posisbility and pt refused admission / further work up / obs  White count nl; Lactic acid nl; Lipase nl; No gallstones on US  Prior h/o ectopic hence D/W  with OB (Dr Lyn HollingsheadAlexander) [facility] Rx and Fu in Clinic  Sat down and explained everything to the patient; Discussed that pathology is a dynamic process; I am only seeing her at one point in time  Advised to return if experiencing any worsening sxs in any way  Closing Discussion done with Patient which includes Review of all Results and Explnation of the POA which patient/ parents/ partners have understood  Karlton LemonGalwankar, Cipriano BunkerSagar C, MD 6:24 PM 01/10/2017        MDM   Decide to obtain history from someone other than the patient: No    Decide to obtain previous medical records: No    Clinical Lab Test(s): Ordered and Reviewed    Diagnostic Tests (Radiology, EKG): Ordered and Reviewed    Independent Visualization (ED US, Wet Prep, Other): No    Discussed patient with NON-ED Provider: OB      ED Disposition   ED Disposition: Discharge      ED Clinical Impression   ED Clinical Impression:   Abdominal pain, unspecified abdominal location

## 2017-01-11 NOTE — ED Provider Notes
History   No chief complaint on file.      HPI    Allergies   Allergen Reactions   ? No Known Allergies      No Known Allergies       Patient's Medications   New Prescriptions    No medications on file   Previous Medications    ALBUTEROL (PROAIR HFA;VENTOLIN HFA) 108 (90 BASE) MCG/ACT AEROSOL SOLUTION    Inhale 2 puffs every 6 hours as needed for wheezing (Dispense with spacer and teach how to use).    IBUPROFEN (ADVIL,MOTRIN) 800 MG PO TABLET    Take 1 tablet by mouth every 8 hours as needed for pain.    ONDANSETRON (ZOFRAN-ODT) 4 MG TABLET DISPERSIBLE    Dissolve 1 tablet in mouth every 8 hours as needed for nausea or vomiting.    TRAMADOL (ULTRAM) 50 MG PO TABLET    1-2 tabs every 6 hours prn pain   Modified Medications    No medications on file   Discontinued Medications    No medications on file       Past Medical History:   Diagnosis Date   ? Asthma    ? Chlamydia infection complicating pregnancy     03/20/11   ? Gonorrhea complicating pregnancy     03/20/11   ? HIV exposure     per pt mother and father deceased from AIDS       Past Surgical History:   Procedure Laterality Date   ? CESAREAN SECTION     ? ECTOPIC PREGNANCY (ABDOMINAL) N/A 10/11/2013    ECTOPIC PREGNANCY (ABDOMINAL) performed by Valinda HoarBurnett, Erin Hale, MD at [facility]   ? LAPAROSCOPY ECTOPIC PREGNANCY N/A 10/11/2013    LAPAROSCOPY ECTOPIC PREGNANCY performed by Valinda HoarBurnett, Erin Hale, MD at [facility]       Family History   Problem Relation Age of Onset   ? Diabetes Other    ? High Blood Pressure Other    ? Diabetes Maternal Grandmother        Social History     Social History   ? Marital status: Single     Spouse name: N/A   ? Number of children: N/A   ? Years of education: N/A     Social History Main Topics   ? Smoking status: Never Smoker   ? Smokeless tobacco: Never Used   ? Alcohol use Yes      Comment: social   ? Drug use: No   ? Sexual activity: Yes     Partners: Male     Other Topics Concern   ? Not on file     Social History Narrative

## 2017-01-11 NOTE — ED Provider Notes
Basophils % 0.4  0 - 1 %   HCG QUANTITATIVE BLOOD - Abnormal     HCG, BETA SUBUNIT, QUANT 62.8 (*) 0.0 - 5.0 mIU/mL    Comment:   The BHCG reagent manufacturer, [employer], publishes the following BHCG guidelines.  These ranges have NOT been verified by [facility].    Weeks                         Weeks  Gestation   HCG mIU/mL        Gestation    HCB mIU/mL    3          5.8-71.2            10        [id number]    4          9.5-750             12        27832-210612    5          [phone]            14        13950-62530    6          158-31795           15        [phone]    7         [phone]          16         [phone]    8        [phone]          17         [phone]    9        [phone]          18         [phone]  Results may be affected by exogenous biotin consumption. Please consult the laboratory if an interference is suspected.   LACTIC ACID, PLASMA - Normal    LACTIC ACID 2.0  0.7 - 2.7 mmol/L   LIPASE - Normal    Lipase 28  0 - 60 U/L   WET PREP   CHLAMYDIA/ GONORRHEA DETECTIO*   CULTURE, URINE   RH ONLY    Rh Type Positive     CBC AND DIFFERENTIAL         Imaging (Read by ED Provider):  Per Radiology: [place] Abdomen Complete    Result Date: 01/10/2017  Abdominal sonography: Indication: Abdominal pain. Technique: Multiple gray scale images of the abdomen were performed in the transverse and longitudinal planes. The gray scale images were supplemented with color Doppler interrogation. Findings: The gallbladder is normal in appearance with no evidence of a calculus, wall thickening, dilatation or other abnormality. There is no intra or extrahepatic biliary ductal dilatation with the common duct measuring 2.6 millimeters. The liver and spleen are both homogeneous and normal in echogenicity and normal in size with the liver measuring 15.5 centimeters in cephalocaudal dimension and the spleen

## 2017-01-11 NOTE — ED Provider Notes
Mouth/Throat: Oropharynx is clear and moist.   Eyes: Conjunctivae are normal. Pupils are equal, round, and reactive to light. Right eye exhibits no discharge. Left eye exhibits no discharge.   Neck: Normal range of motion. Neck supple.   Cardiovascular: Normal rate, regular rhythm, normal heart sounds and intact distal pulses.    Pulmonary/Chest: Effort normal and breath sounds normal. No respiratory distress. She has no wheezes. She has no rales.   Abdominal: She exhibits no distension. There is generalized tenderness. There is no rebound and no guarding.   Genitourinary:   Genitourinary Comments: Permission taken from the patient prior to exam. Pelvic exam: Bimanual nl. Cultures taken. RN Leotis ShamesLauren present throughout exam.   Musculoskeletal: Normal range of motion. She exhibits no edema or tenderness.   Neurological: She is alert and oriented to person, place, and time. No cranial nerve deficit. Coordination normal.   Skin: Skin is warm and dry. She is not diaphoretic.   Psychiatric: She has a normal mood and affect. Her behavior is normal.   Nursing note and vitals reviewed.      Differential DDx: pregnancy, ectopic, GI bleed, other    Is this an Emergent Medical Condition? Yes - Severe Pain/Acute Onset of Symptons  409.901 FS  641.19 FS  627.732 (16) FS    ED Workup   Procedures    Labs:  -   COMPREHENSIVE METABOLIC PANEL - Abnormal        Result Value Ref Range    Sodium 137  135 - 145 mmol/L    Potassium 3.7  3.3 - 4.6 mmol/L    Chloride 101  101 - 110 mmol/L    CO2 26  21 - 29 mmol/L    Urea Nitrogen 4 (*) 6 - 22 mg/dL    Creatinine 1.610.84  0.960.51 - 0.95 mg/dL    BUN/Creatinine Ratio 4.8 (*) 6.0 - 22.0 (calc)    Glucose 125 (*) 71 - 99 mg/dL    Calcium 8.7  8.6 - 04.510.0 mg/dL    Total Protein 6.5  6.5 - 8.3 g/dL    Albumin 3.8  3.8 - 4.9 g/dL    Calc Total Globuin 2.7  gm/dL    ALBUMIN/GLOBULIN RATIO 1.4  (calc)    Total Bilirubin 0.3  0.2 - 1.0 mg/dL    Alkaline Phosphatase 55  35 - 104 IU/L

## 2017-01-11 NOTE — ED Provider Notes
?   No narrative on file       Review of Systems    Physical Exam     ED Triage Vitals   BP    Pulse    Resp    Temp    Temp src    Height    Weight    SpO2    BMI (Calculated)              Physical Exam    Differential DDx: ***    Is this an Emergent Medical Condition? {SH ED EMERGENT MEDICAL CONDITION:[phone]}  409.901 FS  641.19 FS  627.732 (16) FS    ED Workup   Procedures    Labs:  - - No data to display      Imaging (Read by ED Provider):  {Imaging [phone]}      EKG (Read by ED Provider):  {EKG findings:[id number]}        ED Course & Re-Evaluation     ED Course          MDM   Decide to obtain history from someone other than the patient: {SH ED Lamonte SakaiJX MDM - OBTAIN ZOXWRUE:45409}HISTORY:28378}    Decide to obtain previous medical records: [facility]{SH ED Lamonte SakaiJX MDM - PREVIOUS MED REC - NO WJX:91478}YES:28380}    Clinical Lab Test(s): {SH ED Lamonte SakaiJX MDM ORDERED AND REVIEWED:28124}    Diagnostic Tests (Radiology, EKG): {SH ED Lamonte SakaiJX MDM ORDERED AND REVIEWED:28124}    Independent Visualization (ED US, Wet Prep, Other): {SH ED Lamonte SakaiJX MDM NO YES [id number]:86578}WILDCARD:26444}    Discussed patient with NON-ED Provider: {SH ED Lamonte SakaiJX MDM - ANOTHER PROVIDER:28381}      ED Disposition   ED Disposition: No ED Disposition Set      ED Clinical Impression   ED Clinical Impression:   No Clinical Impression Set      ED Patient Status   Patient Status:   {[phone]}        ED Medical Evaluation Initiated   Medical Evaluation Initiated:  Yes, filed at 01/10/17 1502  by Jolene SchimkeGalwankar, Sagar C, MD            Jolene SchimkeGalwankar, Sagar C, MD  01/10/17 [phone]

## 2017-01-11 NOTE — ED Provider Notes
Impression: In light of the patient's clinical history the findings could be related to a completed abortion, a very early nonvisualized intrauterine pregnancy or a nonvisualized ectopic pregnancy. Further clinical correlation with serial quantitative beta hCG values is advised. Read By Burr Medico- Gregory Wynn M.D.  Electronically Verified By - Burr MedicoGregory Wynn M.D.  Released Date Time - 01/10/2017 5:35 PM  Resident -       EKG (Read by ED Provider):  not applicable        ED Course & Re-Evaluation     ED Course      26 y.o. female with h/o asthma presents with c/o abdominal pain x few days. Patient also notes two episodes of vomiting 2 days ago w/ "streaks of blood." Will order labs. IV fluids and Tylenol to be given. Will re-evaluate.  Jolene SchimkeGalwankar, Sagar C, MD 3:34 PM 01/10/2017       Pregnancy positive. OB US ordered.   Jolene SchimkeGalwankar, Sagar C, MD 4:17 PM 01/10/2017        Final findings and deduction:  No RUQ or RLQ tenderness  Tenderness mainly to midabdomen, uper epigastric region  Appendicitis remains a posisbility and pt refused admission / further work up / obs  White count nl; Lactic acid nl; Lipase nl; No gallstones on US  Prior h/o ectopic hence D/W  with OB (Dr Lyn HollingsheadAlexander) [facility]  Sat down and explained everything to the patient; Discussed that pathology is a dynamic process; I am only seeing her at one point in time  Advised to return if experiencing any worsening sxs in any way  Closing Discussion done with Patient which includes Review of all Results and Explnation of the POA which patient/ parents/ partners have understood  Karlton LemonGalwankar, Cipriano BunkerSagar C, MD 6:24 PM 01/10/2017        MDM   Decide to obtain history from someone other than the patient: No    Decide to obtain previous medical records: No    Clinical Lab Test(s): Ordered and Reviewed    Diagnostic Tests (Radiology, EKG): Ordered and Reviewed    Independent Visualization (ED US, Wet Prep, Other): No    Discussed patient with NON-ED Provider: OB

## 2017-01-11 NOTE — ED Provider Notes
AST 13 (*) 14 - 33 IU/L    ALT 8 (*) 10 - 42 IU/L    Osmolality Calc 272.2      Anion Gap 10  4 - 16 mmol/L    EGFR >59  mL/min/1.73M2    Comment:   Reference range: =>90 ml/min/1.73M2  eGFR estimates are unable to accurately differentiate levels of GFR above 60 ml/min/1.73M2.   URINALYSIS W/MICROSCOPY - Abnormal     Color -Ur Yellow  Amber    Clarity, UA Hazy  Hazy    Specific Gravity, Urine 1.014  1.003 - 1.030    pH, Urine 6.0  4.5 - 8.0    Protein-UA Negative  Negative mg/dL    Glucose -Ur Negative  Negative mg/dL    Ketones UA Negative  Negative mg/dL    Bilirubin -Ur Negative  Negative    Blood -Ur Negative  Negative    Nitrite -Ur Negative  Negative    Urobilinogen -Ur Normal  Normal    Leukocytes -Ur Moderate (*) Negative    RBC -Ur 3  0 - 5 /HPF    WBC -Ur 10 (*) 0 - 5 /HPF    Squam Epithel, UA 4  Not established /HPF    Bacteria -Ur Rare (*) None seen /HPF    Mucus -Ur Rare  2+ /LPF    ASCORBIC ACID Negative  20 mg/dL   HCG, QUALITATIVE, URINE - Abnormal     HCG-Urine Positive (*) Negative   CBC AUTODIFF - Abnormal     WBC 5.58  4.5 - 11 x10E3/uL    RBC 4.64  4.20 - 5.40 x10E6/uL    Hemoglobin 12.0  12.0 - 16.0 g/dL    Hematocrit 37.5  37.0 - 47.0 %    MCV 80.8 (*) 82.0 - 101.0 fl    MCH 25.9 (*) 27.0 - 34.0 pg    MCHC 32.0  31.0 - 36.0 g/dL    RDW 15.0  12.0 - 16.1 %    Platelet Count 325  140 - 440 thou/cu mm    MPV 8.8 (*) 9.5 - 11.5 fl    nRBC % 0.0  0.0 - 1.0 %    Absolute NRBC Count 0.00      Neutrophils % 62.0  34.0 - 73.0 %    Lymphocytes % 29.7  25.0 - 45.0 %    Monocytes % 7.0 (*) 2.0 - 6.0 %    Eosinophils % 0.5 (*) 1.0 - 4.0 %    Immature Granulocytes % 0.4  0.0 - 2.0 %    Neutrophils Absolute 3.46  1.80 - 8.70 x10E3/uL    Lymphocytes Absolute 1.66  x10E3/uL    Monocytes Absolute 0.39  x10E3/uL    Eosinophils Absolute 0.03  x10E3/uL    Basophil Absolute 0.02  x10E3/uL    Absolute Immature Granulocytes 0.02 (*) 0 - 0 x10E3/uL    Basophils % 0.4  0 - 1 %   HCG QUANTITATIVE BLOOD - Abnormal

## 2017-01-11 NOTE — ED Provider Notes
Osmolality Calc 272.2      Anion Gap 10  4 - 16 mmol/L    EGFR >59  mL/min/1.73M2    Comment:   Reference range: =>90 ml/min/1.73M2  eGFR estimates are unable to accurately differentiate levels of GFR above 60 ml/min/1.73M2.   URINALYSIS W/MICROSCOPY - Abnormal     Color -Ur Yellow  Hydrographic surveyor, UA Hazy  Hazy    Specific Gravity, Urine 1.014  1.003 - 1.030    pH, Urine 6.0  4.5 - 8.0    Protein-UA Negative  Negative mg/dL    Glucose -Ur Negative  Negative mg/dL    Ketones UA Negative  Negative mg/dL    Bilirubin -Ur Negative  Negative    Blood -Ur Negative  Negative    Nitrite -Ur Negative  Negative    Urobilinogen -Ur Normal  Normal    Leukocytes -Ur Moderate (*) Negative    RBC -Ur 3  0 - 5 /HPF    WBC -Ur 10 (*) 0 - 5 /HPF    Squam Epithel, UA 4  Not established /HPF    Bacteria -Ur Rare (*) None seen /HPF    Mucus -Ur Rare  2+ /LPF    ASCORBIC ACID Negative  20 mg/dL   HCG, QUALITATIVE, URINE - Abnormal     HCG-Urine Positive (*) Negative   CBC AUTODIFF - Abnormal     WBC 5.58  4.5 - 11 x10E3/uL    RBC 4.64  4.20 - 5.40 x10E6/uL    Hemoglobin 12.0  12.0 - 16.0 g/dL    Hematocrit 37.5  37.0 - 47.0 %    MCV 80.8 (*) 82.0 - 101.0 fl    MCH 25.9 (*) 27.0 - 34.0 pg    MCHC 32.0  31.0 - 36.0 g/dL    RDW 15.0  12.0 - 16.1 %    Platelet Count 325  140 - 440 thou/cu mm    MPV 8.8 (*) 9.5 - 11.5 fl    nRBC % 0.0  0.0 - 1.0 %    Absolute NRBC Count 0.00      Neutrophils % 62.0  34.0 - 73.0 %    Lymphocytes % 29.7  25.0 - 45.0 %    Monocytes % 7.0 (*) 2.0 - 6.0 %    Eosinophils % 0.5 (*) 1.0 - 4.0 %    Immature Granulocytes % 0.4  0.0 - 2.0 %    Neutrophils Absolute 3.46  1.80 - 8.70 x10E3/uL    Lymphocytes Absolute 1.66  x10E3/uL    Monocytes Absolute 0.39  x10E3/uL    Eosinophils Absolute 0.03  x10E3/uL    Basophil Absolute 0.02  x10E3/uL    Absolute Immature Granulocytes 0.02 (*) 0 - 0 x10E3/uL    Basophils % 0.4  0 - 1 %   HCG QUANTITATIVE BLOOD - Abnormal     HCG, BETA SUBUNIT, QUANT 62.8 (*) 0.0 - 5.0 mIU/mL

## 2017-01-11 NOTE — ED Provider Notes
Past Surgical History:   Procedure Laterality Date   ? CESAREAN SECTION     ? ECTOPIC PREGNANCY (ABDOMINAL) N/A 10/11/2013    ECTOPIC PREGNANCY (ABDOMINAL) performed by Valinda HoarBurnett, Erin Hale, MD at [facility]   ? LAPAROSCOPY ECTOPIC PREGNANCY N/A 10/11/2013    LAPAROSCOPY ECTOPIC PREGNANCY performed by Valinda HoarBurnett, Erin Hale, MD at [facility]       Family History   Problem Relation Age of Onset   ? Diabetes Other    ? High Blood Pressure Other    ? Diabetes Maternal Grandmother        Social History     Social History   ? Marital status: Single     Spouse name: N/A   ? Number of children: N/A   ? Years of education: N/A     Social History Main Topics   ? Smoking status: Never Smoker   ? Smokeless tobacco: Never Used   ? Alcohol use Yes      Comment: social   ? Drug use: No   ? Sexual activity: Yes     Partners: Male     Other Topics Concern   ? None     Social History Narrative   ? None       Review of Systems   Constitutional: Negative for fever and chills.   HENT: Negative for nasal congestion and sore throat.    Respiratory: Negative for cough and shortness of breath.    Cardiovascular: Negative for chest pain.   Gastrointestinal: Positive for nausea, vomiting and abdominal pain. Negative for diarrhea.   Genitourinary: Negative for dysuria, hematuria, vaginal bleeding and vaginal discharge.   Musculoskeletal: Negative for myalgias and arthralgias.   Skin: Negative for rash.   Neurological: Negative for weakness and light-headedness.   All other systems reviewed and are negative.      Physical Exam     ED Triage Vitals [01/10/17 1524]   BP 111/63   Pulse 91   Resp 16   Temp 36.8 ?C (98.2 ?F)   Temp src Oral   Height 1.6 m   Weight 100.6 kg   SpO2 100 %   BMI (Calculated) 39.37             Physical Exam   Constitutional: She is oriented to person, place, and time. She appears well-developed and well-nourished. No distress.   HENT:   Head: Normocephalic and atraumatic.   Mouth/Throat: Oropharynx is clear and moist.

## 2017-01-11 NOTE — ED Provider Notes
pancreas (the body) is visualized but this appears normal in size, shape and echogenicity. The majority of the pancreas is obscured by overlying bowel gas. Both kidneys are normal in parenchymal echogenicity and there is no evidence of a renal mass, calculus or hydronephrosis. The kidneys are normal and relatively symmetrical in size with the right measuring 9.2 x 4.0 centimeters and the left measuring 9.5 x 5.1 centimeters. There is no abnormal fluid collection identified within the visualized portion of the abdomen. The superior aspects of the abdominal aorta and inferior vena cava appear within normal limits.     Impression: Limited visualization/evaluation of the pancreas as discussed above. Otherwise, an unremarkable examination. Read By Burr Medico- Gregory Wynn M.D.  Electronically Verified By - Burr MedicoGregory Wynn M.D.  Released Date Time - 01/10/2017 5:39 PM  Resident -     Per Radiology: [place] Ob Transvaginal Transabdominal <14 Wks    Result Date: 01/10/2017  Transabdominal and transvaginal pelvic sonography/early obstetrical sonography: Indication: Positive pregnancy test. Pelvic pain. Comparison: Pelvic sonography dated 12/10/2016. Findings: The uterus is within the limits of normal in size measuring approximately 10.8 x 3.8 x 4.7 cm. The endometrial echoes measure 5.6 mm. The uterus is homogeneous in echogenicity with no evidence of a gestational sac or other finding to suggest an intrauterine pregnancy. The left ovary is not visualized transvaginally but can be appreciated on the transabdominal portion of the examination. The left ovary is normal in appearance and normal in size measuring  2.9 x 1.5 x 2.3 cm. Doppler evaluation of the left ovary demonstrates both arterial and venous  flow. The right ovary is not visualized. No adnexal mass is seen. There is no free fluid within the cul-de-sac.     Impression: In light of the patient's clinical history the findings could

## 2017-01-11 NOTE — ED Provider Notes
bowel gas. Both kidneys are normal in parenchymal echogenicity and there is no evidence of a renal mass, calculus or hydronephrosis. The kidneys are normal and relatively symmetrical in size with the right measuring 9.2 x 4.0 centimeters and the left measuring 9.5 x 5.1 centimeters. There is no abnormal fluid collection identified within the visualized portion of the abdomen. The superior aspects of the abdominal aorta and inferior vena cava appear within normal limits.     Impression: Limited visualization/evaluation of the pancreas as discussed above. Otherwise, an unremarkable examination. Read By Burr Medico- Gregory Wynn M.D.  Electronically Verified By - Burr MedicoGregory Wynn M.D.  Released Date Time - 01/10/2017 5:39 PM  Resident -     Per Radiology: [place] Ob Transvaginal Transabdominal <14 Wks    Result Date: 01/10/2017  Transabdominal and transvaginal pelvic sonography/early obstetrical sonography: Indication: Positive pregnancy test. Pelvic pain. Comparison: Pelvic sonography dated 12/10/2016. Findings: The uterus is within the limits of normal in size measuring approximately 10.8 x 3.8 x 4.7 cm. The endometrial echoes measure 5.6 mm. The uterus is homogeneous in echogenicity with no evidence of a gestational sac or other finding to suggest an intrauterine pregnancy. The left ovary is not visualized transvaginally but can be appreciated on the transabdominal portion of the examination. The left ovary is normal in appearance and normal in size measuring  2.9 x 1.5 x 2.3 cm. Doppler evaluation of the left ovary demonstrates both arterial and venous  flow. The right ovary is not visualized. No adnexal mass is seen. There is no free fluid within the cul-de-sac.     Impression: In light of the patient's clinical history the findings could be related to a completed abortion, a very early nonvisualized intrauterine pregnancy or a nonvisualized ectopic pregnancy. Further

## 2017-01-11 NOTE — ED Provider Notes
Comment:   The BHCG reagent manufacturer, [employer], publishes the following BHCG guidelines.  These ranges have NOT been verified by [facility].    Weeks                         Weeks  Gestation   HCG mIU/mL        Gestation    HCB mIU/mL    3          5.8-71.2            10        [id number]    4          9.5-750             12        27832-210612    5          [phone]            14        13950-62530    6          [phone]           15        [phone]    7         [phone]          16         [phone]    8        [phone]          17         [phone]    9        [phone]          18         [phone]  Results may be affected by exogenous biotin consumption. Please consult the laboratory if an interference is suspected.   LACTIC ACID, PLASMA - Normal    LACTIC ACID 2.0  0.7 - 2.7 mmol/L   LIPASE - Normal    Lipase 28  0 - 60 U/L   WET PREP   CHLAMYDIA/ GONORRHEA DETECTIO*   CULTURE, URINE   RH ONLY    Rh Type Positive     CBC AND DIFFERENTIAL         Imaging (Read by ED Provider):  Per Radiology: [place] Abdomen Complete    Result Date: 01/10/2017  Abdominal sonography: Indication: Abdominal pain. Technique: Multiple gray scale images of the abdomen were performed in the transverse and longitudinal planes. The gray scale images were supplemented with color Doppler interrogation. Findings: The gallbladder is normal in appearance with no evidence of a calculus, wall thickening, dilatation or other abnormality. There is no intra or extrahepatic biliary ductal dilatation with the common duct measuring 2.6 millimeters. The liver and spleen are both homogeneous and normal in echogenicity and normal in size with the liver measuring 15.5 centimeters in cephalocaudal dimension and the spleen measuring 7.7 centimeters. The main portal vein measures 0.70 centimeters in diameter and demonstrates hepatopedal flow. Only a portion of the

## 2017-01-11 NOTE — ED Provider Notes
TRAMADOL (ULTRAM) 50 MG PO TABLET    1-2 tabs every 6 hours prn pain       Past Medical History:   Diagnosis Date   ? Asthma    ? Chlamydia infection complicating pregnancy     03/20/11   ? Gonorrhea complicating pregnancy     03/20/11   ? HIV exposure     per pt mother and father deceased from AIDS       Past Surgical History:   Procedure Laterality Date   ? CESAREAN SECTION     ? ECTOPIC PREGNANCY (ABDOMINAL) N/A 10/11/2013    ECTOPIC PREGNANCY (ABDOMINAL) performed by Valinda HoarBurnett, Erin Hale, MD at [facility]   ? LAPAROSCOPY ECTOPIC PREGNANCY N/A 10/11/2013    LAPAROSCOPY ECTOPIC PREGNANCY performed by Valinda HoarBurnett, Erin Hale, MD at [facility]       Family History   Problem Relation Age of Onset   ? Diabetes Other    ? High Blood Pressure Other    ? Diabetes Maternal Grandmother        Social History     Social History   ? Marital status: Single     Spouse name: N/A   ? Number of children: N/A   ? Years of education: N/A     Social History Main Topics   ? Smoking status: Never Smoker   ? Smokeless tobacco: Never Used   ? Alcohol use Yes      Comment: social   ? Drug use: No   ? Sexual activity: Yes     Partners: Male     Other Topics Concern   ? None     Social History Narrative   ? None       Review of Systems   Constitutional: Negative for fever and chills.   HENT: Negative for nasal congestion and sore throat.    Respiratory: Negative for cough and shortness of breath.    Cardiovascular: Negative for chest pain.   Gastrointestinal: Positive for nausea, vomiting and abdominal pain. Negative for diarrhea.   Genitourinary: Negative for dysuria, hematuria, vaginal bleeding and vaginal discharge.   Musculoskeletal: Negative for myalgias and arthralgias.   Skin: Negative for rash.   Neurological: Negative for weakness and light-headedness.   All other systems reviewed and are negative.      Physical Exam     ED Triage Vitals [01/10/17 1524]   BP 111/63   Pulse 91   Resp 16   Temp 36.8 ?C (98.2 ?F)   Temp src Oral

## 2017-01-13 ENCOUNTER — Emergency Department: Admit: 2017-01-13 | Discharge: 2017-01-14

## 2017-01-13 ENCOUNTER — Inpatient Hospital Stay: Admit: 2017-01-13 | Discharge: 2017-01-14

## 2017-01-13 DIAGNOSIS — Z833 Family history of diabetes mellitus: Secondary | ICD-10-CM

## 2017-01-13 DIAGNOSIS — O208 Other hemorrhage in early pregnancy: Principal | ICD-10-CM

## 2017-01-13 DIAGNOSIS — O4690 Antepartum hemorrhage, unspecified, unspecified trimester: Secondary | ICD-10-CM

## 2017-01-13 DIAGNOSIS — J45909 Unspecified asthma, uncomplicated: Principal | ICD-10-CM

## 2017-01-13 DIAGNOSIS — Z79899 Other long term (current) drug therapy: Secondary | ICD-10-CM

## 2017-01-13 DIAGNOSIS — Z206 Contact with and (suspected) exposure to human immunodeficiency virus [HIV]: Secondary | ICD-10-CM

## 2017-01-13 DIAGNOSIS — O200 Threatened abortion: Principal | ICD-10-CM

## 2017-01-13 DIAGNOSIS — O98219 Gonorrhea complicating pregnancy, unspecified trimester: Secondary | ICD-10-CM

## 2017-01-13 NOTE — ED Notes
Report to Miranda RN

## 2017-01-13 NOTE — ED Notes
Bed: ED-28  Expected date:   Expected time:   Means of arrival:   Comments:  Abdom pain

## 2017-01-13 NOTE — ED Triage Notes
Pt to rm 28, c/o vag bleed after a positive pregnancy test. Pt states ;ast peroid was late april and should be coming on soon. AMRN

## 2017-01-14 NOTE — ED Notes
IV removed with catheter intact.  Pressure applied, Time of discharge: 2001  PM., Patient discharged to  Home.  Patient discharged  ambulatory. to exit with belongings in  Stable condition.  Patient escorted by  no one., Written discharge instructions given to  patient.  Patient/recipient  verbalizes discharge instructions.

## 2017-01-16 ENCOUNTER — Inpatient Hospital Stay: Admit: 2017-01-16 | Discharge: 2017-01-16

## 2017-01-16 ENCOUNTER — Emergency Department: Admit: 2017-01-16 | Discharge: 2017-01-16

## 2017-01-16 DIAGNOSIS — J45909 Unspecified asthma, uncomplicated: Principal | ICD-10-CM

## 2017-01-16 DIAGNOSIS — O039 Complete or unspecified spontaneous abortion without complication: Principal | ICD-10-CM

## 2017-01-16 DIAGNOSIS — O98219 Gonorrhea complicating pregnancy, unspecified trimester: Secondary | ICD-10-CM

## 2017-01-16 DIAGNOSIS — Z206 Contact with and (suspected) exposure to human immunodeficiency virus [HIV]: Secondary | ICD-10-CM

## 2017-01-16 DIAGNOSIS — Z833 Family history of diabetes mellitus: Secondary | ICD-10-CM

## 2017-01-16 NOTE — ED Triage Notes
Pt sts here on tuesday and dx with threatened miscarriage and to come back in two days

## 2017-01-16 NOTE — ED Provider Notes
History     Chief Complaint   Patient presents with   ? Follow-up       26 y/o G3P1 (ectopic x1), early gestation F , arrives to ED for recheck of hcg and repeat US to evaluate for ectopic vs miscarriage. Pt was seen in ED 01/10/17 with new pregnancy diagnosis, seen again 01/13/17 for vaginal bleeding, no findings on US, low Hcg. Pt denies any physical symptoms, no abdbominal pain, vaginal bleeding, urinary symptoms.       The history is provided by the patient.       Allergies   Allergen Reactions   ? No Known Allergies      No Known Allergies       Patient's Medications   New Prescriptions    No medications on file   Previous Medications    NITROFURANTOIN (MACROCRYSTALLINE) (MACROBID) 100 MG PO CAPSULE    Take 1 capsule by mouth 2 times daily for 10 days.    PRENATAL MULTI +DHA 27-0.8-228 MG PO CAPSULE    Take 1 capsule by mouth daily.   Modified Medications    No medications on file   Discontinued Medications    No medications on file       Past Medical History:   Diagnosis Date   ? Asthma    ? Chlamydia infection complicating pregnancy     03/20/11   ? Gonorrhea complicating pregnancy     03/20/11   ? HIV exposure     per pt mother and father deceased from AIDS       Past Surgical History:   Procedure Laterality Date   ? CESAREAN SECTION     ? ECTOPIC PREGNANCY (ABDOMINAL) N/A 10/11/2013    ECTOPIC PREGNANCY (ABDOMINAL) performed by Valinda HoarBurnett, Erin Hale, MD at [facility]   ? LAPAROSCOPY ECTOPIC PREGNANCY N/A 10/11/2013    LAPAROSCOPY ECTOPIC PREGNANCY performed by Valinda HoarBurnett, Erin Hale, MD at [facility]       Family History   Problem Relation Age of Onset   ? Diabetes Other    ? High Blood Pressure Other    ? Diabetes Maternal Grandmother        Social History     Social History   ? Marital status: Single     Spouse name: N/A   ? Number of children: N/A   ? Years of education: N/A     Social History Main Topics   ? Smoking status: Never Smoker   ? Smokeless tobacco: Never Used   ? Alcohol use Yes      Comment: social

## 2017-01-16 NOTE — ED Notes
Time of discharge: 1338  PM., Patient discharged to  Home.  Patient discharged  ambulatory. to exit with belongings in  Stable condition.  Patient escorted by  no one., Written discharge instructions given to  patient.  Patient/recipient  verbalizes discharge instructions.

## 2017-01-16 NOTE — ED Provider Notes
?   Drug use: No   ? Sexual activity: Yes     Partners: Male     Other Topics Concern   ? None     Social History Narrative   ? None       Review of Systems   Constitutional: Negative.  Negative for fever, chills and fatigue.   HENT: Negative.    Respiratory: Negative.    Cardiovascular: Negative.    Gastrointestinal: Negative.  Negative for nausea, vomiting and abdominal pain.   Genitourinary: Negative for dysuria, urgency, flank pain, vaginal bleeding, vaginal discharge, vaginal pain and pelvic pain.   Musculoskeletal: Negative.    Skin: Negative.    Neurological: Negative.    All other systems reviewed and are negative.      Physical Exam     ED Triage Vitals [01/16/17 1114]   BP 116/75   Pulse 90   Resp 19   Temp 36.8 ?C (98.2 ?F)   Temp src Oral   Height 1.6 m   Weight 96.8 kg   SpO2 99 %   BMI (Calculated) 37.88             Physical Exam   Constitutional: She is oriented to person, place, and time. She appears well-developed and well-nourished.   HENT:   Head: Normocephalic and atraumatic.   Mouth/Throat: Oropharynx is clear and moist.   Eyes: EOM are normal. Pupils are equal, round, and reactive to light.   Cardiovascular: Normal rate, regular rhythm and normal heart sounds.    Pulmonary/Chest: Effort normal and breath sounds normal.   Abdominal: Soft. Bowel sounds are normal. She exhibits no distension and no mass. There is no tenderness. There is no rebound and no guarding.   Musculoskeletal: Normal range of motion.   Neurological: She is alert and oriented to person, place, and time.   Skin: Skin is warm and dry.   Nursing note and vitals reviewed.      Differential DDx: ***    Is this an Emergent Medical Condition? {SH ED EMERGENT MEDICAL CONDITION:[phone]}  409.901 FS  641.19 FS  627.732 (16) FS    ED Workup   Procedures    Labs:  - - No data to display      Imaging (Read by ED Provider):  {Imaging findings:[phone]}          ED Course & Re-Evaluation     ED Course

## 2017-01-16 NOTE — ED Notes
Bed: ED-24  Expected date:   Expected time:   Means of arrival:   Comments:  26 y/o poss miscarriage: CAB

## 2017-01-16 NOTE — ED Provider Notes
clinical correlation with serial quantitative beta hCG values is advised. Read By Burr Medico- Gregory Wynn M.D.  Electronically Verified By - Burr MedicoGregory Wynn M.D.  Released Date Time - 01/10/2017 5:35 PM  Resident -       ED Course & Re-Evaluation     ED Course      Recheck for vaginal bleeding in pregnancy, possible ectopic   Last HCG 60, today 15   Pt asymptomatic   US without visualized pregnancy   Exam benign   Given RTED precautions for miscarriage, advised close Gyn follow up within 3  days, referral provided.     MDM   Decide to obtain history from someone other than the patient: No    Decide to obtain previous medical records: Yes - The review of the records selected below is documented in the ED Note : ( Prior ED visit )    Clinical Lab Test(s): Ordered and Reviewed    Diagnostic Tests (Radiology, EKG): Ordered and Reviewed    Independent Visualization (ED US, Wet Prep, Other): No    Discussed patient with NON-ED Provider: None      ED Disposition   ED Disposition: Discharge      ED Clinical Impression   ED Clinical Impression:   Miscarriage      ED Patient Status   Patient Status:   Good        ED Medical Evaluation Initiated   Medical Evaluation Initiated:  Yes, filed at 01/16/17 1137  by Darliss RidgelBonner, William Mason, PA-C

## 2017-01-16 NOTE — ED Provider Notes
Recheck for vaginal bleeding in pregnancy, possible ectopic       MDM   Decide to obtain history from someone other than the patient: {SH ED Lamonte SakaiJX MDM - OBTAIN [id number]:19147}HISTORY:28378}    Decide to obtain previous medical records: [facility]{SH ED Lamonte SakaiJX MDM - PREVIOUS MED REC - NO WGN:56213}YES:28380}    Clinical Lab Test(s): {SH ED Lamonte SakaiJX MDM ORDERED AND REVIEWED:28124}    Diagnostic Tests (Radiology, EKG): {SH ED Lamonte SakaiJX MDM ORDERED AND REVIEWED:28124}    Independent Visualization (ED US, Wet Prep, Other): {SH ED Lamonte SakaiJX MDM NO YES [id number]:96295}WILDCARD:26444}    Discussed patient with NON-ED Provider: {SH ED Lamonte SakaiJX MDM - ANOTHER PROVIDER:28381}      ED Disposition   ED Disposition: No ED Disposition Set      ED Clinical Impression   ED Clinical Impression:   No Clinical Impression Set      ED Patient Status   Patient Status:   {SH ED [facility] PAT[facility] STATUS:[phone]}        ED Medical Evaluation Initiated   Medical Evaluation Initiated:  Yes, filed at 01/16/17 1137  by Darliss RidgelBonner, William Mason, PA-C

## 2017-01-16 NOTE — ED Provider Notes
Monocytes % 7.6 (*) 2.0 - 6.0 %    Eosinophils % 0.4 (*) 1.0 - 4.0 %    Immature Granulocytes % 0.2  0.0 - 2.0 %    Neutrophils Absolute 2.92  1.80 - 8.70 x10E3/uL    Lymphocytes Absolute 1.76  x10E3/uL    Monocytes Absolute 0.39  x10E3/uL    Eosinophils Absolute 0.02  x10E3/uL    Basophil Absolute 0.02  x10E3/uL    Absolute Immature Granulocytes 0.01 (*) 0 - 0 x10E3/uL    Basophils % 0.4  0 - 1 %   CBC AND DIFFERENTIAL         Per Radiology: [place] Abdomen Complete    Result Date: 01/10/2017  Abdominal sonography: Indication: Abdominal pain. Technique: Multiple gray scale images of the abdomen were performed in the transverse and longitudinal planes. The gray scale images were supplemented with color Doppler interrogation. Findings: The gallbladder is normal in appearance with no evidence of a calculus, wall thickening, dilatation or other abnormality. There is no intra or extrahepatic biliary ductal dilatation with the common duct measuring 2.6 millimeters. The liver and spleen are both homogeneous and normal in echogenicity and normal in size with the liver measuring 15.5 centimeters in cephalocaudal dimension and the spleen measuring 7.7 centimeters. The main portal vein measures 0.70 centimeters in diameter and demonstrates hepatopedal flow. Only a portion of the pancreas (the body) is visualized but this appears normal in size, shape and echogenicity. The majority of the pancreas is obscured by overlying bowel gas. Both kidneys are normal in parenchymal echogenicity and there is no evidence of a renal mass, calculus or hydronephrosis. The kidneys are normal and relatively symmetrical in size with the right measuring 9.2 x 4.0 centimeters and the left measuring 9.5 x 5.1 centimeters. There is no abnormal fluid collection identified within the visualized portion of the abdomen. The superior aspects of the abdominal aorta and inferior vena cava appear within normal limits.

## 2017-01-16 NOTE — ED Provider Notes
Result Value Ref Range    Sodium 138  135 - 145 mmol/L    Potassium 3.3  3.3 - 4.6 mmol/L    Chloride 102  101 - 110 mmol/L    CO2 26  21 - 29 mmol/L    Urea Nitrogen 4 (*) 6 - 22 mg/dL    Creatinine 0.77  0.51 - 0.95 mg/dL    BUN/Creatinine Ratio 5.2 (*) 6.0 - 22.0 (calc)    Glucose 103 (*) 71 - 99 mg/dL    Calcium 8.9  8.6 - 10.0 mg/dL    Osmolality Calc 272.8      Anion Gap 10  4 - 16 mmol/L    EGFR >59  mL/min/1.73M2    Comment:   Reference range: =>90 ml/min/1.73M2  eGFR estimates are unable to accurately differentiate levels of GFR above 60 ml/min/1.73M2.   HCG QUANTITATIVE BLOOD - Abnormal     HCG, BETA SUBUNIT, QUANT 16.6 (*) 0.0 - 5.0 mIU/mL    Comment:   The BHCG reagent manufacturer, [employer], publishes the following BHCG guidelines.  These ranges have NOT been verified by [facility].    Weeks                         Weeks  Gestation   HCG mIU/mL        Gestation    HCB mIU/mL    3          5.8-71.2            10        [id number]    4          9.5-750             12        [id number]    5          [phone]            14        13950-62530    6          [phone]           15        [phone]    7         [phone]          16         [phone]    8        [phone]          17         [phone]    9        [phone]          18         [phone]  Results may be affected by exogenous biotin consumption. Please consult the laboratory if an interference is suspected.   CBC AUTODIFF - Abnormal     WBC 5.12  4.5 - 11 x10E3/uL    RBC 4.54  4.20 - 5.40 x10E6/uL    Hemoglobin 12.2  12.0 - 16.0 g/dL    Hematocrit 36.2 (*) 37.0 - 47.0 %    MCV 79.7 (*) 82.0 - 101.0 fl    MCH 26.9 (*) 27.0 - 34.0 pg    MCHC 33.7  31.0 - 36.0 g/dL    RDW 14.7  12.0 - 16.1 %    Platelet Count 317  140 - 440 thou/cu mm    MPV 8.8 (*) 9.5 - 11.5 fl  nRBC % 0.0  0.0 - 1.0 %    Absolute NRBC Count 0.00      Neutrophils % 57.0  34.0 - 73.0 %    Lymphocytes % 34.4  25.0 - 45.0 %

## 2017-01-16 NOTE — ED Provider Notes
Impression: Limited visualization/evaluation of the pancreas as discussed above. Otherwise, an unremarkable examination. Read By Burr Medico- Gregory Wynn M.D.  Electronically Verified By - Burr MedicoGregory Wynn M.D.  Released Date Time - 01/10/2017 5:39 PM  Resident -     Per Radiology: [place] Ob Transabdominal & Transvaginal W/ Doppler    Result Date: 01/13/2017  PELVIC ULTRASOUND / FIRST TRIMESTER OB ULTRASOUND ORDERING HISTORY:  Pelvic pain. Positive pregnancy test. COMPARISON: None TECHNIQUE: Transabdominal pelvic ultrasound was initially performed with limited visualization of the endometrium and/or adnexal structures.  Therefore, transvaginal pelvic ultrasound was performed for better evaluation. . In addition to grayscale imaging, both color flow Doppler and spectral analysis were performed. FINDINGS: The uterus measures 9 x 4 x 5  cm. No definite gestational sac is visualized. The right ovary measures 3 x 1 x 3 cm with small follicles. Both arterial and venous flow are identified. The left ovary measures 3.5 x 1.5 x 2.5 cm with small follicles. Both arterial and venous flow are identified. There is no free fluid identified within the cul-de-sac.     Pregnancy of unknown location. Differential includes ectopic pregnancy, very early intrauterine  pregnancy, or failed pregnancy. Recommend continued close clinical follow-up, including serial  quantitative serum beta hCG measurements and short-term follow-up pelvic sonogram (preferably within 3-5 days). I personally reviewed the images and the residents findings and agree with the above. Read By Para March- Erick Blaudeau  Electronically Verified By - Para MarchErick Blaudeau  Released Date Time - 01/13/2017 11:04 PM  Resident - Mirna Miresyler Campbell    Per Radiology: [place] Ob Transvaginal Transabdominal <14 Wks    Result Date: 01/16/2017  PELVIC ULTRASOUND / FIRST TRIMESTER OB ULTRASOUND Procedure:  US OB TRANSABDOMINAL & TRANSVAGINAL Ordering History:  Vaginal bleeding in

## 2017-01-16 NOTE — ED Provider Notes
?   Sexual activity: Yes     Partners: Male     Other Topics Concern   ? None     Social History Narrative   ? None       Review of Systems   Constitutional: Negative.  Negative for fever, chills and fatigue.   HENT: Negative.    Respiratory: Negative.    Cardiovascular: Negative.    Gastrointestinal: Negative.  Negative for nausea, vomiting and abdominal pain.   Genitourinary: Negative for dysuria, urgency, flank pain, vaginal bleeding, vaginal discharge, vaginal pain and pelvic pain.   Musculoskeletal: Negative.    Skin: Negative.    Neurological: Negative.    All other systems reviewed and are negative.      Physical Exam     ED Triage Vitals [01/16/17 1114]   BP 116/75   Pulse 90   Resp 19   Temp 36.8 ?C (98.2 ?F)   Temp src Oral   Height 1.6 m   Weight 96.8 kg   SpO2 99 %   BMI (Calculated) 37.88             Physical Exam   Constitutional: She is oriented to person, place, and time. She appears well-developed and well-nourished.   HENT:   Head: Normocephalic and atraumatic.   Mouth/Throat: Oropharynx is clear and moist.   Eyes: EOM are normal. Pupils are equal, round, and reactive to light.   Cardiovascular: Normal rate, regular rhythm and normal heart sounds.    Pulmonary/Chest: Effort normal and breath sounds normal.   Abdominal: Soft. Bowel sounds are normal. She exhibits no distension and no mass. There is no tenderness. There is no rebound and no guarding.   Genitourinary:   Genitourinary Comments: Os closed, no CMT, no uterine tenderness, no blood in vaginal vault    Musculoskeletal: Normal range of motion.   Neurological: She is alert and oriented to person, place, and time.   Skin: Skin is warm and dry.   Nursing note and vitals reviewed.      Differential DDx: miscarriage, ectopic pregnancy, lab error, other    Is this an Emergent Medical Condition? Yes - Impairment of Bodily Function  409.901 FS  641.19 FS  627.732 (16) FS    ED Workup   Procedures    Labs:  -   BASIC METABOLIC PANEL - Abnormal

## 2017-01-16 NOTE — ED Provider Notes
pregnancy Additional History: None Comparison: 01/13/2017 Technique: Transabdominal pelvic ultrasound was initially performed with limited visualization of the endometrium and/or adnexal structures.  Therefore, transvaginal pelvic ultrasound was performed for better evaluation. The uterus is normal in appearance measuring 9.1 x 3.4 x 4.3 cm. No intrauterine gestation. The  endometrium is not thickened. The right ovary measures 2.6 x 1.5 x 1.7 cm. Normal vascularity. The left ovary is not well seen. No free fluid in the pelvis.     Impression: No significant change from prior. No intrauterine gestation. Read By Rolly Salter- Haley Letter  Electronically Verified By - Rolly SalterHaley Letter  Released Date Time - 01/16/2017 12:52 PM  Resident -     Per Radiology: [place] Ob Transvaginal Transabdominal <14 Wks    Result Date: 01/10/2017  Transabdominal and transvaginal pelvic sonography/early obstetrical sonography: Indication: Positive pregnancy test. Pelvic pain. Comparison: Pelvic sonography dated 12/10/2016. Findings: The uterus is within the limits of normal in size measuring approximately 10.8 x 3.8 x 4.7 cm. The endometrial echoes measure 5.6 mm. The uterus is homogeneous in echogenicity with no evidence of a gestational sac or other finding to suggest an intrauterine pregnancy. The left ovary is not visualized transvaginally but can be appreciated on the transabdominal portion of the examination. The left ovary is normal in appearance and normal in size measuring  2.9 x 1.5 x 2.3 cm. Doppler evaluation of the left ovary demonstrates both arterial and venous  flow. The right ovary is not visualized. No adnexal mass is seen. There is no free fluid within the cul-de-sac.     Impression: In light of the patient's clinical history the findings could be related to a completed abortion, a very early nonvisualized intrauterine pregnancy or a nonvisualized ectopic pregnancy. Further

## 2017-01-16 NOTE — ED Provider Notes
History     Chief Complaint   Patient presents with   ? Follow-up       26 y/o G3P1 (ectopic x1), early gestation F , arrives to ED for recheck of hcg and repeat US to evaluate for ectopic vs miscarriage. Pt was seen in ED 01/10/17 with new pregnancy diagnosis, seen again 01/13/17 for vaginal bleeding, no findings on US, low Hcg. Pt denies any physical symptoms, no abdbominal pain, vaginal bleeding, urinary symptoms.       The history is provided by the patient.       Allergies   Allergen Reactions   ? No Known Allergies      No Known Allergies       Discharge Medication List as of 01/16/2017  1:25 PM      CONTINUE these medications which have NOT CHANGED    Details   nitrofurantoin (macrocrystalline) (MACROBID) 100 MG PO Capsule Take 1 capsule by mouth 2 times daily for 10 days.Disp-20 capsule, R-0, Normal      PRENATAL MULTI +DHA 27-0.8-228 MG PO Capsule Take 1 capsule by mouth daily.Disp-15 capsule, R-0, Normal             Past Medical History:   Diagnosis Date   ? Asthma    ? Chlamydia infection complicating pregnancy     03/20/11   ? Gonorrhea complicating pregnancy     03/20/11   ? HIV exposure     per pt mother and father deceased from AIDS       Past Surgical History:   Procedure Laterality Date   ? CESAREAN SECTION     ? ECTOPIC PREGNANCY (ABDOMINAL) N/A 10/11/2013    ECTOPIC PREGNANCY (ABDOMINAL) performed by Valinda HoarBurnett, Erin Hale, MD at [facility]   ? LAPAROSCOPY ECTOPIC PREGNANCY N/A 10/11/2013    LAPAROSCOPY ECTOPIC PREGNANCY performed by Valinda HoarBurnett, Erin Hale, MD at [facility]       Family History   Problem Relation Age of Onset   ? Diabetes Other    ? High Blood Pressure Other    ? Diabetes Maternal Grandmother        Social History     Social History   ? Marital status: Single     Spouse name: N/A   ? Number of children: N/A   ? Years of education: N/A     Social History Main Topics   ? Smoking status: Never Smoker   ? Smokeless tobacco: Never Used   ? Alcohol use Yes      Comment: social   ? Drug use: No

## 2017-01-16 NOTE — ED Notes
PT to US

## 2017-01-25 NOTE — ED Provider Notes
Abdominal sonography: Indication: Abdominal pain. Technique: Multiple gray scale images of the abdomen were performed in the transverse and longitudinal planes. The gray scale images were supplemented with color Doppler interrogation. Findings: The gallbladder is normal in appearance with no evidence of a calculus, wall thickening, dilatation or other abnormality. There is no intra or extrahepatic biliary ductal dilatation with the common duct measuring 2.6 millimeters. The liver and spleen are both homogeneous and normal in echogenicity and normal in size with the liver measuring 15.5 centimeters in cephalocaudal dimension and the spleen measuring 7.7 centimeters. The main portal vein measures 0.70 centimeters in diameter and demonstrates hepatopedal flow. Only a portion of the pancreas (the body) is visualized but this appears normal in size, shape and echogenicity. The majority of the pancreas is obscured by overlying bowel gas. Both kidneys are normal in parenchymal echogenicity and there is no evidence of a renal mass, calculus or hydronephrosis. The kidneys are normal and relatively symmetrical in size with the right measuring 9.2 x 4.0 centimeters and the left measuring 9.5 x 5.1 centimeters. There is no abnormal fluid collection identified within the visualized portion of the abdomen. The superior aspects of the abdominal aorta and inferior vena cava appear within normal limits.     Impression: Limited visualization/evaluation of the pancreas as discussed above. Otherwise, an unremarkable examination. Read By Burr Medico- Gregory Wynn M.D.  Electronically Verified By - Burr MedicoGregory Wynn M.D.  Released Date Time - 01/10/2017 5:39 PM  Resident -     Per Radiology: [place] Ob Transabdominal & Transvaginal W/ Doppler    Result Date: 01/13/2017  PELVIC ULTRASOUND / FIRST TRIMESTER OB ULTRASOUND ORDERING HISTORY:  Pelvic pain COMPARISON: None TECHNIQUE: Transabdominal pelvic ultrasound

## 2017-01-25 NOTE — ED Provider Notes
Yes, filed at 01/13/17 1541  by Stefano GaulNicoloso, Francesca, PA-C

## 2017-01-25 NOTE — ED Provider Notes
Right adnexum displays tenderness. Right adnexum displays no mass. Left adnexum displays tenderness. Left adnexum displays no mass. There is bleeding in the vagina. No erythema in the vagina. No foreign body in the vagina. No signs of injury around the vagina. No vaginal discharge found.   Genitourinary Comments: OS closed moderated dark red blood with clots   Musculoskeletal: She exhibits no edema.   Neurological: She is alert and oriented to person, place, and time. She exhibits normal muscle tone. Coordination normal.   Skin: Skin is warm and dry. No rash noted. She is not diaphoretic.   Psychiatric: She has a normal mood and affect. Her behavior is normal. Judgment and thought content normal.   Nursing note and vitals reviewed.      Differential DDx: ectopic, miscarriage, other    Is this an Emergent Medical Condition? Yes - Severe Pain/Acute Onset of Symptons and Yes - Threat to Patient or Fetus  409.901 FS  641.19 FS  627.732 (16) FS    ED Workup   Procedures    Labs:  - - No data to display      Imaging (Read by ED Provider):  Per Radiology: [place] Abdomen Complete    Result Date: 01/10/2017  Abdominal sonography: Indication: Abdominal pain. Technique: Multiple gray scale images of the abdomen were performed in the transverse and longitudinal planes. The gray scale images were supplemented with color Doppler interrogation. Findings: The gallbladder is normal in appearance with no evidence of a calculus, wall thickening, dilatation or other abnormality. There is no intra or extrahepatic biliary ductal dilatation with the common duct measuring 2.6 millimeters. The liver and spleen are both homogeneous and normal in echogenicity and normal in size with the liver measuring 15.5 centimeters in cephalocaudal dimension and the spleen measuring 7.7 centimeters. The main portal vein measures 0.70 centimeters in diameter and demonstrates hepatopedal flow. Only a portion of the

## 2017-01-25 NOTE — ED Provider Notes
intrauterine pregnancy or a nonvisualized ectopic pregnancy. Further clinical correlation with serial quantitative beta hCG values is advised. Read By Burr Medico- Gregory Wynn M.D.  Electronically Verified By - Burr MedicoGregory Wynn M.D.  Released Date Time - 01/10/2017 5:35 PM  Resident -         EKG (Read by ED Provider):  not applicable        ED Course & Re-Evaluation     ED Course      Pt is B+. Paged Dr. Manson PasseyBrown due to unchanged HCG and pregnancy of unknown location  Stefano GaulFrancesca Nicoloso, PA-C 7:21 PM 01/13/2017    Dr. Manson PasseyBrown wants her to follow up in 2 days for repeat HCG and abdominal exam. Discussed results with pt and need for return to ED or 3rd floor on [address]. Pt agrees with plan. Return sooner if any fevers, increased pain, new or worse symptoms.   Stefano GaulFrancesca Nicoloso, PA-C 7:28 PM 01/13/2017    MDM   Decide to obtain history from someone other than the patient: No    Decide to obtain previous medical records: Yes - The review of the records selected below is documented in the ED Note : ( Prior ED visit and Results )    Clinical Lab Test(s): Ordered and Reviewed    Diagnostic Tests (Radiology, EKG): Ordered and Reviewed    Independent Visualization (ED US, Wet Prep, Other): No    Discussed patient with NON-ED Provider: Consultant      ED Disposition   ED Disposition: Discharge      ED Clinical Impression   ED Clinical Impression:   Threatened abortion in first trimester  Vaginal bleeding during pregnancy      ED Patient Status   Patient Status:   Good        ED Medical Evaluation Initiated   Medical Evaluation Initiated:  Yes, filed at 01/13/17 1541  by Stefano GaulNicoloso, Francesca, PA-C

## 2017-01-25 NOTE — ED Provider Notes
Per Radiology: [place] Ob Transvaginal Transabdominal <14 Wks    Result Date: 01/10/2017  Transabdominal and transvaginal pelvic sonography/early obstetrical sonography: Indication: Positive pregnancy test. Pelvic pain. Comparison: Pelvic sonography dated 12/10/2016. Findings: The uterus is within the limits of normal in size measuring approximately 10.8 x 3.8 x 4.7 cm. The endometrial echoes measure 5.6 mm. The uterus is homogeneous in echogenicity with no evidence of a gestational sac or other finding to suggest an intrauterine pregnancy. The left ovary is not visualized transvaginally but can be appreciated on the transabdominal portion of the examination. The left ovary is normal in appearance and normal in size measuring  2.9 x 1.5 x 2.3 cm. Doppler evaluation of the left ovary demonstrates both arterial and venous  flow. The right ovary is not visualized. No adnexal mass is seen. There is no free fluid within the cul-de-sac.     Impression: In light of the patient's clinical history the findings could be related to a completed abortion, a very early nonvisualized intrauterine pregnancy or a nonvisualized ectopic pregnancy. Further clinical correlation with serial quantitative beta hCG values is advised. Read By Burr Medico- Gregory Wynn M.D.  Electronically Verified By - Burr MedicoGregory Wynn M.D.  Released Date Time - 01/10/2017 5:35 PM  Resident -         EKG (Read by ED Provider):  not applicable        ED Course & Re-Evaluation     ED Course      Pt is B+. Paged Dr. Manson PasseyBrown due to unchanged HCG and pregnancy of unknown location  Stefano GaulFrancesca Nicoloso, PA-C 7:21 PM 01/13/2017    Dr. Manson PasseyBrown wants her to follow up in 2 days for repeat HCG and abdominal exam. Discussed results with pt and need for return to ED or 3rd floor on [address]. Pt agrees with plan. Return sooner if any fevers, increased pain, new or worse symptoms.   Stefano GaulFrancesca Nicoloso, PA-C 7:28 PM 01/13/2017    MDM   Decide to obtain history from someone other than the patient: No

## 2017-01-25 NOTE — ED Provider Notes
Per Radiology: [place] Ob Transvaginal Transabdominal <14 Wks    Result Date: 01/10/2017  Transabdominal and transvaginal pelvic sonography/early obstetrical sonography: Indication: Positive pregnancy test. Pelvic pain. Comparison: Pelvic sonography dated 12/10/2016. Findings: The uterus is within the limits of normal in size measuring approximately 10.8 x 3.8 x 4.7 cm. The endometrial echoes measure 5.6 mm. The uterus is homogeneous in echogenicity with no evidence of a gestational sac or other finding to suggest an intrauterine pregnancy. The left ovary is not visualized transvaginally but can be appreciated on the transabdominal portion of the examination. The left ovary is normal in appearance and normal in size measuring  2.9 x 1.5 x 2.3 cm. Doppler evaluation of the left ovary demonstrates both arterial and venous  flow. The right ovary is not visualized. No adnexal mass is seen. There is no free fluid within the cul-de-sac.     Impression: In light of the patient's clinical history the findings could be related to a completed abortion, a very early nonvisualized intrauterine pregnancy or a nonvisualized ectopic pregnancy. Further clinical correlation with serial quantitative beta hCG values is advised. Read By Burr Medico- Gregory Wynn M.D.  Electronically Verified By - Burr MedicoGregory Wynn M.D.  Released Date Time - 01/10/2017 5:35 PM  Resident -         EKG (Read by ED Provider):  not applicable        ED Course & Re-Evaluation     ED Course      Pt is B+. Paged Dr. Manson PasseyBrown due to unchanged HCG and pregnancy of unknown location  Stefano GaulFrancesca Nicoloso, PA-C 7:21 PM 01/13/2017      MDM   Decide to obtain history from someone other than the patient: No    Decide to obtain previous medical records: Yes - The review of the records selected below is documented in the ED Note : ( Prior ED visit and Results )    Clinical Lab Test(s): Ordered and Reviewed    Diagnostic Tests (Radiology, EKG): Ordered and Reviewed

## 2017-01-25 NOTE — ED Provider Notes
Decide to obtain previous medical records: Yes - The review of the records selected below is documented in the ED Note : ( Prior ED visit and Results )    Clinical Lab Test(s): Ordered and Reviewed    Diagnostic Tests (Radiology, EKG): Ordered and Reviewed    Independent Visualization (ED US, Wet Prep, Other): No    Discussed patient with NON-ED Provider: Consultant      ED Disposition   ED Disposition: No ED Disposition Set      ED Clinical Impression   ED Clinical Impression:   No Clinical Impression Set      ED Patient Status   Patient Status:   Good        ED Medical Evaluation Initiated   Medical Evaluation Initiated:  Yes, filed at 01/13/17 1541  by Stefano GaulNicoloso, Francesca, PA-C

## 2017-01-25 NOTE — ED Provider Notes
was initially performed with limited visualization of the endometrium and/or adnexal structures.  Therefore, transvaginal pelvic ultrasound was performed for better evaluation. . In addition to grayscale imaging, both color flow Doppler and spectral analysis were performed. FINDINGS: The uterus measures 9 x 4 x 5  cm. No definite gestational sac is visualized. The right ovary measures 3 x 1 x 3 cm with small follicles. Both arterial and venous flow are identified. The left ovary measures 3.5 x 1.5 x 2.5 cm with small follicles. Both arterial and venous flow are identified. There is no free fluid identified within the cul-de-sac.     Pregnancy of unknown location. Differential includes ectopic pregnancy, very early pregnancy, or failed pregnancy. Recommend continued follow-up pelvic ultrasound beta-hCG.    Per Radiology: [place] Ob Transvaginal Transabdominal <14 Wks    Result Date: 01/10/2017  Transabdominal and transvaginal pelvic sonography/early obstetrical sonography: Indication: Positive pregnancy test. Pelvic pain. Comparison: Pelvic sonography dated 12/10/2016. Findings: The uterus is within the limits of normal in size measuring approximately 10.8 x 3.8 x 4.7 cm. The endometrial echoes measure 5.6 mm. The uterus is homogeneous in echogenicity with no evidence of a gestational sac or other finding to suggest an intrauterine pregnancy. The left ovary is not visualized transvaginally but can be appreciated on the transabdominal portion of the examination. The left ovary is normal in appearance and normal in size measuring  2.9 x 1.5 x 2.3 cm. Doppler evaluation of the left ovary demonstrates both arterial and venous  flow. The right ovary is not visualized. No adnexal mass is seen. There is no free fluid within the cul-de-sac.     Impression: In light of the patient's clinical history the findings could be related to a completed abortion, a very early nonvisualized

## 2017-01-25 NOTE — ED Provider Notes
Spouse name: N/A   ? Number of children: N/A   ? Years of education: N/A     Social History Main Topics   ? Smoking status: Never Smoker   ? Smokeless tobacco: Never Used   ? Alcohol use Yes      Comment: social   ? Drug use: No   ? Sexual activity: Yes     Partners: Male     Other Topics Concern   ? Not on file     Social History Narrative   ? No narrative on file       Review of Systems   Constitutional: Negative for fever, chills and fatigue.   Respiratory: Negative for chest tightness, shortness of breath and wheezing.    Cardiovascular: Negative for chest pain.   Gastrointestinal: Negative for nausea and abdominal pain.   Genitourinary: Positive for vaginal bleeding and pelvic pain. Negative for dysuria, vaginal discharge and dyspareunia.   Musculoskeletal: Negative for back pain.   Skin: Negative for color change, pallor and rash.   Neurological: Negative for dizziness, weakness and light-headedness.   All other systems reviewed and are negative.      Physical Exam     ED Triage Vitals   BP    Pulse    Resp    Temp    Temp src    Height    Weight    SpO2    BMI (Calculated)              Physical Exam   Constitutional: She is oriented to person, place, and time. She appears well-developed and well-nourished. No distress.   HENT:   Head: Normocephalic and atraumatic.   Nose: Nose normal.   Mouth/Throat: Oropharynx is clear and moist. No oropharyngeal exudate.   Eyes: Conjunctivae are normal. Pupils are equal, round, and reactive to light.   Neck: Neck supple. No tracheal deviation present.   Cardiovascular: Normal rate, regular rhythm and normal heart sounds.    Pulmonary/Chest: Effort normal and breath sounds normal. No stridor. No respiratory distress. She has no wheezes.   Abdominal: Soft. There is tenderness in the suprapubic area. There is no rebound and no guarding.   Musculoskeletal: She exhibits no edema.   Neurological: She is alert and oriented to person, place, and time. She

## 2017-01-25 NOTE — ED Provider Notes
History   No chief complaint on file.      Pt reports she is pregnant and woke up with "heavy bleeding and pelvic pain" today      The history is provided by the patient and medical records. No language interpreter was used.   Vaginal Bleeding   Quality:  Dark red  Duration:  1 day  Timing:  Constant  Chronicity:  New  Menstrual history:  Missed period  Number of pads used:  1  Context: spontaneously    Relieved by:  Nothing  Ineffective treatments:  None tried  Associated symptoms: no abdominal pain, no back pain, no dizziness, no dyspareunia, no dysuria, no fatigue, no fever, no nausea and no vaginal discharge    Risk factors: prior miscarriage and unprotected sex        Allergies   Allergen Reactions   ? No Known Allergies      No Known Allergies       Patient's Medications   New Prescriptions    No medications on file   Previous Medications    NITROFURANTOIN (MACROCRYSTALLINE) (MACROBID) 100 MG PO CAPSULE    Take 1 capsule by mouth 2 times daily for 10 days.    PRENATAL MULTI +DHA 27-0.8-228 MG PO CAPSULE    Take 1 capsule by mouth daily.   Modified Medications    No medications on file   Discontinued Medications    No medications on file       Past Medical History:   Diagnosis Date   ? Asthma    ? Chlamydia infection complicating pregnancy     03/20/11   ? Gonorrhea complicating pregnancy     03/20/11   ? HIV exposure     per pt mother and father deceased from AIDS       Past Surgical History:   Procedure Laterality Date   ? CESAREAN SECTION     ? ECTOPIC PREGNANCY (ABDOMINAL) N/A 10/11/2013    ECTOPIC PREGNANCY (ABDOMINAL) performed by Valinda HoarBurnett, Erin Hale, MD at [facility]   ? LAPAROSCOPY ECTOPIC PREGNANCY N/A 10/11/2013    LAPAROSCOPY ECTOPIC PREGNANCY performed by Valinda HoarBurnett, Erin Hale, MD at [facility]       Family History   Problem Relation Age of Onset   ? Diabetes Other    ? High Blood Pressure Other    ? Diabetes Maternal Grandmother        Social History     Social History   ? Marital status: Single

## 2017-01-25 NOTE — ED Provider Notes
Genitourinary: Cervix exhibits no motion tenderness and no discharge. Right adnexum displays tenderness. Right adnexum displays no mass. Left adnexum displays tenderness. Left adnexum displays no mass. There is bleeding in the vagina. No erythema in the vagina. No foreign body in the vagina. No signs of injury around the vagina. No vaginal discharge found.   Genitourinary Comments: OS closed moderated dark red blood with clots   Musculoskeletal: She exhibits no edema.   Neurological: She is alert and oriented to person, place, and time. She exhibits normal muscle tone. Coordination normal.   Skin: Skin is warm and dry. No rash noted. She is not diaphoretic.   Psychiatric: She has a normal mood and affect. Her behavior is normal. Judgment and thought content normal.   Nursing note and vitals reviewed.      Differential DDx: ectopic, miscarriage, other    Is this an Emergent Medical Condition? Yes - Severe Pain/Acute Onset of Symptons and Yes - Threat to Patient or Fetus  409.901 FS  641.19 FS  627.732 (16) FS    ED Workup   Procedures    Labs:  -   URINALYSIS W/MICROSCOPY - Abnormal        Result Value Ref Range    Color -Ur Yellow  Amber    Clarity, UA Hazy  Hazy    Specific Gravity, Urine 1.023  1.003 - 1.030    pH, Urine 6.0  4.5 - 8.0    Protein-UA Negative  Negative mg/dL    Glucose -Ur Negative  Negative mg/dL    Ketones UA Trace (*) Negative mg/dL    Bilirubin -Ur Negative  Negative    Blood -Ur Moderate (*) Negative    Nitrite -Ur Negative  Negative    Urobilinogen -Ur 2.0  (*) Normal mg/dL    Leukocytes -Ur Trace (*) Negative    RBC -Ur 15 (*) 0 - 5 /HPF    WBC -Ur 6 (*) 0 - 5 /HPF    Squam Epithel, UA <1  Not established /HPF    Bacteria -Ur Rare (*) None seen /HPF    Mucus -Ur 1+  2+ /LPF    ASCORBIC ACID Negative  20 mg/dL   HCG, QUALITATIVE, URINE - Abnormal     HCG-Urine Positive (*) Negative   HCG QUANTITATIVE BLOOD - Abnormal     HCG, BETA SUBUNIT, QUANT 60.4 (*) 0.0 - 5.0 mIU/mL    Comment:

## 2017-01-25 NOTE — ED Provider Notes
Independent Visualization (ED US, Wet Prep, Other): No    Discussed patient with NON-ED Provider: None      ED Disposition   ED Disposition: No ED Disposition Set      ED Clinical Impression   ED Clinical Impression:   No Clinical Impression Set      ED Patient Status   Patient Status:   Good        ED Medical Evaluation Initiated   Medical Evaluation Initiated:  Yes, filed at 01/13/17 1541  by Stefano GaulNicoloso, Francesca, PA-C

## 2017-01-25 NOTE — ED Provider Notes
History     Chief Complaint   Patient presents with   ? Vaginal Bleeding       Pt reports she is pregnant and woke up with "heavy bleeding and pelvic pain" today      The history is provided by the patient and medical records. No language interpreter was used.   Vaginal Bleeding   Quality:  Dark red  Duration:  1 day  Timing:  Constant  Chronicity:  New  Menstrual history:  Missed period  Number of pads used:  1  Context: spontaneously    Relieved by:  Nothing  Ineffective treatments:  None tried  Associated symptoms: no abdominal pain, no back pain, no dizziness, no dyspareunia, no dysuria, no fatigue, no fever, no nausea and no vaginal discharge    Risk factors: prior miscarriage and unprotected sex        Allergies   Allergen Reactions   ? No Known Allergies      No Known Allergies       Discharge Medication List as of 01/13/2017  7:30 PM      CONTINUE these medications which have NOT CHANGED    Details   nitrofurantoin (macrocrystalline) (MACROBID) 100 MG PO Capsule Take 1 capsule by mouth 2 times daily for 10 days.Disp-20 capsule, R-0, Normal      PRENATAL MULTI +DHA 27-0.8-228 MG PO Capsule Take 1 capsule by mouth daily.Disp-15 capsule, R-0, Normal             Past Medical History:   Diagnosis Date   ? Asthma    ? Chlamydia infection complicating pregnancy     03/20/11   ? Gonorrhea complicating pregnancy     03/20/11   ? HIV exposure     per pt mother and father deceased from AIDS       Past Surgical History:   Procedure Laterality Date   ? CESAREAN SECTION     ? ECTOPIC PREGNANCY (ABDOMINAL) N/A 10/11/2013    ECTOPIC PREGNANCY (ABDOMINAL) performed by Valinda HoarBurnett, Erin Hale, MD at [facility]   ? LAPAROSCOPY ECTOPIC PREGNANCY N/A 10/11/2013    LAPAROSCOPY ECTOPIC PREGNANCY performed by Valinda HoarBurnett, Erin Hale, MD at [facility]       Family History   Problem Relation Age of Onset   ? Diabetes Other    ? High Blood Pressure Other    ? Diabetes Maternal Grandmother        Social History     Social History

## 2017-01-25 NOTE — ED Provider Notes
pancreas (the body) is visualized but this appears normal in size, shape and echogenicity. The majority of the pancreas is obscured by overlying bowel gas. Both kidneys are normal in parenchymal echogenicity and there is no evidence of a renal mass, calculus or hydronephrosis. The kidneys are normal and relatively symmetrical in size with the right measuring 9.2 x 4.0 centimeters and the left measuring 9.5 x 5.1 centimeters. There is no abnormal fluid collection identified within the visualized portion of the abdomen. The superior aspects of the abdominal aorta and inferior vena cava appear within normal limits.     Impression: Limited visualization/evaluation of the pancreas as discussed above. Otherwise, an unremarkable examination. Read By Burr Medico- Gregory Wynn M.D.  Electronically Verified By - Burr MedicoGregory Wynn M.D.  Released Date Time - 01/10/2017 5:39 PM  Resident -     Per Radiology: [place] Ob Transabdominal & Transvaginal W/ Doppler    Result Date: 01/13/2017  PELVIC ULTRASOUND / FIRST TRIMESTER OB ULTRASOUND ORDERING HISTORY:  Pelvic pain COMPARISON: None TECHNIQUE: Transabdominal pelvic ultrasound was initially performed with limited visualization of the endometrium and/or adnexal structures.  Therefore, transvaginal pelvic ultrasound was performed for better evaluation. . In addition to grayscale imaging, both color flow Doppler and spectral analysis were performed. FINDINGS: The uterus measures 9 x 4 x 5  cm. No definite gestational sac is visualized. The right ovary measures 3 x 1 x 3 cm with small follicles. Both arterial and venous flow are identified. The left ovary measures 3.5 x 1.5 x 2.5 cm with small follicles. Both arterial and venous flow are identified. There is no free fluid identified within the cul-de-sac.     Pregnancy of unknown location. Differential includes ectopic pregnancy, very early pregnancy, or failed pregnancy. Recommend continued follow-up pelvic ultrasound beta-hCG.

## 2017-01-25 NOTE — ED Provider Notes
Right adnexum displays tenderness. Right adnexum displays no mass. Left adnexum displays tenderness. Left adnexum displays no mass. There is bleeding in the vagina. No erythema in the vagina. No foreign body in the vagina. No signs of injury around the vagina. No vaginal discharge found.   Genitourinary Comments: OS closed moderated dark red blood with clots   Musculoskeletal: She exhibits no edema.   Neurological: She is alert and oriented to person, place, and time. She exhibits normal muscle tone. Coordination normal.   Skin: Skin is warm and dry. No rash noted. She is not diaphoretic.   Psychiatric: She has a normal mood and affect. Her behavior is normal. Judgment and thought content normal.   Nursing note and vitals reviewed.      Differential DDx: ectopic, miscarriage, other    Is this an Emergent Medical Condition? Yes - Severe Pain/Acute Onset of Symptons and Yes - Threat to Patient or Fetus  409.901 FS  641.19 FS  627.732 (16) FS    ED Workup   Procedures    Labs:  - - No data to display      Imaging (Read by ED Provider):  {Imaging findings:[id number]}      EKG (Read by ED Provider):  not applicable        ED Course & Re-Evaluation     ED Course      Pt is B+    MDM   Decide to obtain history from someone other than the patient: No    Decide to obtain previous medical records: Yes - The review of the records selected below is documented in the ED Note : ( Prior ED visit and Results )    Clinical Lab Test(s): Ordered and Reviewed    Diagnostic Tests (Radiology, EKG): Ordered and Reviewed    Independent Visualization (ED US, Wet Prep, Other): No    Discussed patient with NON-ED Provider: None      ED Disposition   ED Disposition: No ED Disposition Set      ED Clinical Impression   ED Clinical Impression:   No Clinical Impression Set      ED Patient Status   Patient Status:   Good        ED Medical Evaluation Initiated   Medical Evaluation Initiated:

## 2017-01-25 NOTE — ED Provider Notes
intrauterine pregnancy or a nonvisualized ectopic pregnancy. Further clinical correlation with serial quantitative beta hCG values is advised. Read By Burr Medico- Gregory Wynn M.D.  Electronically Verified By - Burr MedicoGregory Wynn M.D.  Released Date Time - 01/10/2017 5:35 PM  Resident -         EKG (Read by ED Provider):  not applicable        ED Course & Re-Evaluation     ED Course      Pt is B+. Paged Dr. Manson PasseyBrown due to unchanged HCG and pregnancy of unknown location  Stefano GaulFrancesca Nicoloso, PA-C 7:21 PM 01/13/2017    Dr. Manson PasseyBrown wants her to follow up in 2 days for repeat HCG and abdominal exam. Discussed results with pt and need for return to ED or 3rd floor on [address]. Pt agrees with plan. Return sooner if any fevers, increased pain, new or worse symptoms.   Stefano GaulFrancesca Nicoloso, PA-C 7:28 PM 01/13/2017    MDM   Decide to obtain history from someone other than the patient: No    Decide to obtain previous medical records: Yes - The review of the records selected below is documented in the ED Note : ( Prior ED visit and Results )    Clinical Lab Test(s): Ordered and Reviewed    Diagnostic Tests (Radiology, EKG): Ordered and Reviewed    Independent Visualization (ED US, Wet Prep, Other): No    Discussed patient with NON-ED Provider: Consultant      ED Disposition   ED Disposition: Discharge      ED Clinical Impression   ED Clinical Impression:   Threatened abortion in first trimester  Vaginal bleeding during pregnancy      ED Patient Status   Patient Status:   Good        ED Medical Evaluation Initiated   Medical Evaluation Initiated:  Yes, filed at 01/13/17 1541  by Stefano GaulNicoloso, Francesca, PA-C         The patient was seen exclusively by the PA/ARNP, and was not seen by me. I was immediately available in the Emergency Department for consultation as needed.       Rogelia RohrerStorch, Ian David, MD  01/25/17 1003

## 2017-01-25 NOTE — ED Provider Notes
exhibits normal muscle tone. Coordination normal.   Skin: Skin is warm and dry. No rash noted. She is not diaphoretic.   Psychiatric: She has a normal mood and affect. Her behavior is normal. Judgment and thought content normal.   Nursing note and vitals reviewed.      Differential DDx: ectopic, miscarriage, other    Is this an Emergent Medical Condition? Yes - Severe Pain/Acute Onset of Symptons and Yes - Threat to Patient or Fetus  409.901 FS  641.19 FS  627.732 (16) FS    ED Workup   Procedures    Labs:  - - No data to display      Imaging (Read by ED Provider):  {Imaging findings:[id number]}      EKG (Read by ED Provider):  not applicable        ED Course & Re-Evaluation     ED Course      Pt is B+    MDM   Decide to obtain history from someone other than the patient: No    Decide to obtain previous medical records: Yes - The review of the records selected below is documented in the ED Note : ( Prior ED visit and Results )    Clinical Lab Test(s): Ordered and Reviewed    Diagnostic Tests (Radiology, EKG): Ordered and Reviewed    Independent Visualization (ED US, Wet Prep, Other): No    Discussed patient with NON-ED Provider: None      ED Disposition   ED Disposition: No ED Disposition Set      ED Clinical Impression   ED Clinical Impression:   No Clinical Impression Set      ED Patient Status   Patient Status:   Good        ED Medical Evaluation Initiated   Medical Evaluation Initiated:  Yes, filed at 01/13/17 1541  by Stefano GaulNicoloso, Francesca, PA-C

## 2017-01-25 NOTE — ED Provider Notes
?   Marital status: Single     Spouse name: N/A   ? Number of children: N/A   ? Years of education: N/A     Social History Main Topics   ? Smoking status: Never Smoker   ? Smokeless tobacco: Never Used   ? Alcohol use Yes      Comment: social   ? Drug use: No   ? Sexual activity: Yes     Partners: Male     Other Topics Concern   ? None     Social History Narrative   ? None       Review of Systems   Constitutional: Negative for fever, chills and fatigue.   Respiratory: Negative for chest tightness, shortness of breath and wheezing.    Cardiovascular: Negative for chest pain.   Gastrointestinal: Negative for nausea and abdominal pain.   Genitourinary: Positive for vaginal bleeding and pelvic pain. Negative for dysuria, vaginal discharge and dyspareunia.   Musculoskeletal: Negative for back pain.   Skin: Negative for color change, pallor and rash.   Neurological: Negative for dizziness, weakness and light-headedness.   All other systems reviewed and are negative.      Physical Exam     ED Triage Vitals [01/13/17 1614]   BP 128/76   Pulse 69   Resp 16   Temp 36.9 ?C (98.5 ?F)   Temp src Oral   Height 1.6 m   Weight 96.5 kg   SpO2 98 %   BMI (Calculated) 37.77             Physical Exam   Constitutional: She is oriented to person, place, and time. She appears well-developed and well-nourished. No distress.   HENT:   Head: Normocephalic and atraumatic.   Nose: Nose normal.   Mouth/Throat: Oropharynx is clear and moist. No oropharyngeal exudate.   Eyes: Conjunctivae are normal. Pupils are equal, round, and reactive to light.   Neck: Neck supple. No tracheal deviation present.   Cardiovascular: Normal rate, regular rhythm and normal heart sounds.    Pulmonary/Chest: Effort normal and breath sounds normal. No stridor. No respiratory distress. She has no wheezes.   Abdominal: Soft. There is tenderness in the suprapubic area. There is no rebound and no guarding.

## 2017-01-25 NOTE — ED Provider Notes
Spouse name: N/A   ? Number of children: N/A   ? Years of education: N/A     Social History Main Topics   ? Smoking status: Never Smoker   ? Smokeless tobacco: Never Used   ? Alcohol use Yes      Comment: social   ? Drug use: No   ? Sexual activity: Yes     Partners: Male     Other Topics Concern   ? Not on file     Social History Narrative   ? No narrative on file       Review of Systems   Constitutional: Negative for fever, chills and fatigue.   Respiratory: Negative for chest tightness, shortness of breath and wheezing.    Cardiovascular: Negative for chest pain.   Gastrointestinal: Negative for nausea and abdominal pain.   Genitourinary: Positive for vaginal bleeding and pelvic pain. Negative for dysuria, vaginal discharge and dyspareunia.   Musculoskeletal: Negative for back pain.   Skin: Negative for color change, pallor and rash.   Neurological: Negative for dizziness, weakness and light-headedness.   All other systems reviewed and are negative.      Physical Exam     ED Triage Vitals   BP    Pulse    Resp    Temp    Temp src    Height    Weight    SpO2    BMI (Calculated)              Physical Exam   Constitutional: She is oriented to person, place, and time. She appears well-developed and well-nourished. No distress.   HENT:   Head: Normocephalic and atraumatic.   Nose: Nose normal.   Mouth/Throat: Oropharynx is clear and moist. No oropharyngeal exudate.   Eyes: Conjunctivae are normal. Pupils are equal, round, and reactive to light.   Neck: Neck supple. No tracheal deviation present.   Cardiovascular: Normal rate, regular rhythm and normal heart sounds.    Pulmonary/Chest: Effort normal and breath sounds normal. No stridor. No respiratory distress. She has no wheezes.   Abdominal: Soft. There is tenderness in the suprapubic area. There is no rebound and no guarding.   Genitourinary: Cervix exhibits no motion tenderness and no discharge.

## 2017-01-25 NOTE — ED Provider Notes
exhibits normal muscle tone. Coordination normal.   Skin: Skin is warm and dry. No rash noted. She is not diaphoretic.   Psychiatric: She has a normal mood and affect. Her behavior is normal. Judgment and thought content normal.   Nursing note and vitals reviewed.      Differential DDx: ectopic, miscarriage, other    Is this an Emergent Medical Condition? Yes - Severe Pain/Acute Onset of Symptons and Yes - Threat to Patient or Fetus  409.901 FS  641.19 FS  627.732 (16) FS    ED Workup   Procedures    Labs:  - - No data to display      Imaging (Read by ED Provider):  {Imaging findings:[id number]}      EKG (Read by ED Provider):  not applicable        ED Course & Re-Evaluation     ED Course          MDM   Decide to obtain history from someone other than the patient: No    Decide to obtain previous medical records: Yes - The review of the records selected below is documented in the ED Note : ( Prior ED visit and Results )    Clinical Lab Test(s): Ordered and Reviewed    Diagnostic Tests (Radiology, EKG): Ordered and Reviewed    Independent Visualization (ED US, Wet Prep, Other): No    Discussed patient with NON-ED Provider: None      ED Disposition   ED Disposition: No ED Disposition Set      ED Clinical Impression   ED Clinical Impression:   No Clinical Impression Set      ED Patient Status   Patient Status:   Good        ED Medical Evaluation Initiated   Medical Evaluation Initiated:  Yes, filed at 01/13/17 1541  by Stefano GaulNicoloso, Francesca, PA-C

## 2017-01-25 NOTE — ED Provider Notes
The BHCG reagent manufacturer, [employer], publishes the following BHCG guidelines.  These ranges have NOT been verified by [facility].    Weeks                         Weeks  Gestation   HCG mIU/mL        Gestation    HCB mIU/mL    3          5.8-71.2            10        [id number]    4          9.5-750             12        [id number]    5          [phone]            14        13950-62530    6          [phone]           15        [phone]    7         [phone]          16         [phone]    8        [phone]          17         [phone]    9        [phone]          18         [phone]  Results may be affected by exogenous biotin consumption. Please consult the laboratory if an interference is suspected.   CBC AUTODIFF - Abnormal     WBC 6.75  4.5 - 11 x10E3/uL    RBC 4.89  4.20 - 5.40 x10E6/uL    Hemoglobin 13.0  12.0 - 16.0 g/dL    Hematocrit 51.839.4  84.137.0 - 47.0 %    MCV 80.6 (*) 82.0 - 101.0 fl    MCH 26.6 (*) 27.0 - 34.0 pg    MCHC 33.0  31.0 - 36.0 g/dL    RDW 66.014.7  63.012.0 - 16.016.1 %    Platelet Count 384  140 - 440 thou/cu mm    MPV 8.7 (*) 9.5 - 11.5 fl    nRBC % 0.0  0.0 - 1.0 %    Absolute NRBC Count 0.00      Neutrophils % 65.1  34.0 - 73.0 %    Lymphocytes % 27.0  25.0 - 45.0 %    Monocytes % 7.0 (*) 2.0 - 6.0 %    Eosinophils % 0.3 (*) 1.0 - 4.0 %    Immature Granulocytes % 0.3  0.0 - 2.0 %    Neutrophils Absolute 4.40  1.80 - 8.70 x10E3/uL    Lymphocytes Absolute 1.82  x10E3/uL    Monocytes Absolute 0.47  x10E3/uL    Eosinophils Absolute 0.02  x10E3/uL    Basophil Absolute 0.02  x10E3/uL    Absolute Immature Granulocytes 0.02 (*) 0 - 0 x10E3/uL    Basophils % 0.3  0 - 1 %   WET PREP    Trichomonas Vaginalis Negative for Trichomonas       YEAST Negative for Yeast     CHLAMYDIA/ GONORRHEA DETECTIO*  CBC AND DIFFERENTIAL         Imaging (Read by ED Provider):  Per Radiology: US Abdomen Complete    Result Date: 01/10/2017

## 2017-03-04 ENCOUNTER — Encounter

## 2017-03-11 ENCOUNTER — Inpatient Hospital Stay: Admit: 2017-03-11 | Discharge: 2017-03-11

## 2017-03-11 DIAGNOSIS — A599 Trichomoniasis, unspecified: Secondary | ICD-10-CM

## 2017-03-11 DIAGNOSIS — Z833 Family history of diabetes mellitus: Secondary | ICD-10-CM

## 2017-03-11 DIAGNOSIS — Z206 Contact with and (suspected) exposure to human immunodeficiency virus [HIV]: Secondary | ICD-10-CM

## 2017-03-11 DIAGNOSIS — Z8249 Family history of ischemic heart disease and other diseases of the circulatory system: Secondary | ICD-10-CM

## 2017-03-11 DIAGNOSIS — A5901 Trichomonal vulvovaginitis: Secondary | ICD-10-CM

## 2017-03-11 DIAGNOSIS — J45909 Unspecified asthma, uncomplicated: Principal | ICD-10-CM

## 2017-03-11 DIAGNOSIS — O98219 Gonorrhea complicating pregnancy, unspecified trimester: Secondary | ICD-10-CM

## 2017-03-11 DIAGNOSIS — Z79899 Other long term (current) drug therapy: Secondary | ICD-10-CM

## 2017-03-11 DIAGNOSIS — R109 Unspecified abdominal pain: Secondary | ICD-10-CM

## 2017-03-11 DIAGNOSIS — R102 Pelvic and perineal pain: Principal | ICD-10-CM

## 2017-03-11 DIAGNOSIS — N3090 Cystitis, unspecified without hematuria: Secondary | ICD-10-CM

## 2017-03-11 MED ORDER — DOXYCYCLINE HYCLATE 100 MG PO TABS
100 mg | Freq: Two times a day (BID) | ORAL | 0 refills | Status: CP
Start: 2017-03-11 — End: ?

## 2017-03-11 MED ORDER — METRONIDAZOLE 500 MG PO TABS
2000 mg | Freq: Once | ORAL | Status: CP
Start: 2017-03-11 — End: ?

## 2017-03-11 NOTE — ED Notes
Time of discharge: 1600  PM., Patient discharged to  Home.  Patient discharged  ambulatory. to exit with belongings in  Stable condition.  Patient escorted by  no one., Written discharge instructions given to  patient.  Patient/recipient  verbalizes discharge instructions.

## 2017-03-11 NOTE — ED Triage Notes
Pt to room 27 with c/o dysuria x 2 days. Pt states it feels like she has a UTI. Pt LMP x 2 weeks ago, normal. Pt denies abnormal bleeding or discharge.

## 2017-03-11 NOTE — ED Notes
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

## 2017-03-11 NOTE — ED Provider Notes
CHLAMYDIA/ GONORRHEA DETECTIO*         Imaging (Read by ED Provider):  not applicable      EKG (Read by ED Provider):  not applicable        ED Course & Re-Evaluation      Dorthea CoveChristina Gaby, PA-C 3:30 PM 03/11/2017  Patient reports her abdominal pain is improved. Results discussed. All questions answered. I urged patient to follow up with PCP tomorrow for re-evaluation and to return immediately for any new or worsening symptoms, patient verbalized understanding and agreement.     MDM   Decide to obtain history from someone other than the patient: No    Decide to obtain previous medical records: No    Clinical Lab Test(s): Ordered and Reviewed    Diagnostic Tests (Radiology, EKG): N/A    Independent Visualization (ED US, Wet Prep, Other): No    Discussed patient with NON-ED Provider: None      ED Disposition   ED Disposition: Discharge      ED Clinical Impression   ED Clinical Impression:   Cystitis  Trichomoniasis  Acute abdominal pain      ED Patient Status   Patient Status:   Good        ED Medical Evaluation Initiated   Medical Evaluation Initiated:  Yes, filed at 03/11/17 1320  by Dorthea CoveGaby, Christina, PA-C

## 2017-03-11 NOTE — ED Provider Notes
Pulmonary/Chest: Effort normal and breath sounds normal. No respiratory distress.   Abdominal: Soft. Bowel sounds are normal. She exhibits no distension and no mass. There is tenderness. There is no rebound and no guarding.   +mild supurapubic tenderness on palpation   Genitourinary:   Genitourinary Comments: Michelle RN present during vaginal exam: normal external vagina; speculum exam: cervical os closed; +mild white thin discharge, no masses, no lesions; Bimanual exam: No CMT, no adnexal tenderness, no masses, no lesions.    Neurological: She is alert and oriented to person, place, and time.   Skin: Skin is warm and dry. She is not diaphoretic.   Psychiatric: She has a normal mood and affect. Her behavior is normal. Judgment and thought content normal.   Nursing note and vitals reviewed.      Differential DDx: abdominal pain/ UTI/ BV/ Trichomoniasis/ VD    Is this an Emergent Medical Condition? Yes - Severe Pain/Acute Onset of Symptons  409.901 FS  641.19 FS  627.732 (16) FS    ED Workup   Procedures    Labs:  -   URINALYSIS W/MICROSCOPY - Abnormal        Result Value Ref Range    Color -Ur Yellow  Amber    Clarity, UA Hazy  Hazy    Specific Gravity, Urine 1.028  1.003 - 1.030    pH, Urine 5.0  4.5 - 8.0    Protein-UA 30  (*) Negative mg/dL    Glucose -Ur Negative  Negative mg/dL    Ketones UA Negative  Negative mg/dL    Bilirubin -Ur Negative  Negative    Blood -Ur Negative  Negative    Nitrite -Ur Negative  Negative    Urobilinogen -Ur 2.0  (*) Normal mg/dL    Leukocytes -Ur Moderate (*) Negative    RBC -Ur 4  0 - 5 /HPF    WBC -Ur 6 (*) 0 - 5 /HPF    Squam Epithel, UA 5  Not established /HPF    Hyaline Casts, UA 2  0 - 5 /LPF    Bacteria -Ur Rare (*) None seen /HPF    Mucus -Ur 3+  2+ /LPF    ASCORBIC ACID Negative  20 mg/dL   HCG, QUALITATIVE, URINE - Normal    HCG-Urine Negative  Negative   WET PREP    Trichomonas Vaginalis Positive for Trichomonas      YEAST Negative for Yeast

## 2017-03-11 NOTE — ED Provider Notes
?   LAPAROSCOPY ECTOPIC PREGNANCY N/A 10/11/2013    LAPAROSCOPY ECTOPIC PREGNANCY performed by Valinda HoarBurnett, Erin Hale, MD at [facility]       Family History   Problem Relation Age of Onset   ? Diabetes Other    ? High Blood Pressure Other    ? Diabetes Maternal Grandmother        Social History     Social History   ? Marital status: Single     Spouse name: N/A   ? Number of children: N/A   ? Years of education: N/A     Social History Main Topics   ? Smoking status: Never Smoker   ? Smokeless tobacco: Never Used   ? Alcohol use Yes      Comment: social   ? Drug use: No   ? Sexual activity: Yes     Partners: Male     Other Topics Concern   ? None     Social History Narrative   ? None       Review of Systems   Constitutional: Negative for fever, chills and fatigue.   HENT: Negative for sore throat.    Respiratory: Negative for cough and shortness of breath.    Cardiovascular: Negative for chest pain.   Gastrointestinal: Positive for abdominal pain. Negative for nausea, vomiting, diarrhea, constipation, melena, hematochezia, anorexia, flatus and hematemesis.   Genitourinary: Positive for urgency and frequency. Negative for dysuria, hematuria, flank pain, vaginal bleeding, vaginal discharge, difficulty urinating and dyspareunia.   All other systems reviewed and are negative.      Physical Exam     ED Triage Vitals [03/11/17 1237]   BP 103/63   Pulse 89   Resp 16   Temp 36.8 ?C (98.2 ?F)   Temp src Oral   Height 1.651 m   Weight 94.3 kg   SpO2 100 %   BMI (Calculated) 34.69             Physical Exam   Constitutional: She is oriented to person, place, and time. She appears well-developed and well-nourished. No distress.   HENT:   Head: Normocephalic and atraumatic.   Eyes: Pupils are equal, round, and reactive to light. Conjunctivae and EOM are normal.   Neck: Normal range of motion. Neck supple. No tracheal deviation present. No thyromegaly present.   Cardiovascular: Normal rate and regular rhythm.

## 2017-03-11 NOTE — ED Provider Notes
History     Chief Complaint   Patient presents with   ? Painful Urination       26 year old female presents with complaints of pelvic pain and dysuria x 2 days.       The history is provided by the patient. No language interpreter was used.   Abdominal Pain   Pain location:  Suprapubic  Pain quality: pressure    Pain radiates to:  Does not radiate  Pain severity:  Moderate  Timing:  Constant  Progression:  Unchanged  Chronicity:  New  Relieved by:  Nothing  Worsened by:  Urination  Ineffective treatments:  None tried  Associated symptoms: no anorexia, no belching, no chest pain, no chills, no constipation, no cough, no diarrhea, no dysuria, no fatigue, no fever, no flatus, no hematemesis, no hematochezia, no hematuria, no melena, no nausea, no shortness of breath, no sore throat, no vaginal bleeding, no vaginal discharge and no vomiting        Allergies   Allergen Reactions   ? No Known Allergies      No Known Allergies       Patient's Medications   New Prescriptions    DOXYCYCLINE (VIBRA-TABS) 100 MG PO TABLET    Take 1 tablet by mouth 2 times daily for 10 days.   Previous Medications    NITROFURANTOIN (MACROCRYSTALLINE) (MACROBID) 100 MG PO CAPSULE    Take 1 capsule by mouth 2 times daily for 10 days.    PRENATAL MULTI +DHA 27-0.8-228 MG PO CAPSULE    Take 1 capsule by mouth daily.   Modified Medications    No medications on file   Discontinued Medications    DOXYCYCLINE (VIBRAMYCIN) 100 MG CAPSULE    Take 1 capsule by mouth 2 times daily for 10 days.       Past Medical History:   Diagnosis Date   ? Asthma    ? Chlamydia infection complicating pregnancy     03/20/11   ? Gonorrhea complicating pregnancy     03/20/11   ? HIV exposure     per pt mother and father deceased from AIDS       Past Surgical History:   Procedure Laterality Date   ? CESAREAN SECTION     ? ECTOPIC PREGNANCY (ABDOMINAL) N/A 10/11/2013    ECTOPIC PREGNANCY (ABDOMINAL) performed by Valinda HoarBurnett, Erin Hale[facility]

## 2017-03-12 NOTE — ED Attestation Note
Attestation   The patient was seen exclusively by the PA/ARNP, and was not seen by me. I was immediately available in the Emergency Department for consultation as needed.             Gus PumaPerry, Paul K, MD  03/12/17 [phone]

## 2018-06-22 DIAGNOSIS — Z021 Encounter for pre-employment examination: Principal | ICD-10-CM

## 2018-09-20 ENCOUNTER — Inpatient Hospital Stay: Admit: 2018-09-20 | Discharge: 2018-09-21

## 2018-09-20 DIAGNOSIS — J45909 Unspecified asthma, uncomplicated: Principal | ICD-10-CM

## 2018-09-20 DIAGNOSIS — Z206 Contact with and (suspected) exposure to human immunodeficiency virus [HIV]: Secondary | ICD-10-CM

## 2018-09-20 DIAGNOSIS — O98219 Gonorrhea complicating pregnancy, unspecified trimester: Secondary | ICD-10-CM

## 2018-09-20 DIAGNOSIS — Z538 Procedure and treatment not carried out for other reasons: Principal | ICD-10-CM

## 2018-10-27 ENCOUNTER — Inpatient Hospital Stay: Admit: 2018-10-27 | Discharge: 2018-10-27

## 2018-10-27 DIAGNOSIS — Z538 Procedure and treatment not carried out for other reasons: Principal | ICD-10-CM

## 2019-03-29 ENCOUNTER — Emergency Department (HOSPITAL_COMMUNITY): Payer: Medicaid - Out of State

## 2019-03-29 ENCOUNTER — Encounter (HOSPITAL_COMMUNITY): Payer: Self-pay | Admitting: Emergency Medicine

## 2019-03-29 ENCOUNTER — Emergency Department (HOSPITAL_COMMUNITY)
Admission: EM | Admit: 2019-03-29 | Discharge: 2019-03-29 | Disposition: A | Discharge status: Home / Self Care | Payer: Medicaid - Out of State | Attending: Emergency Medicine | Admitting: Emergency Medicine

## 2019-03-29 DIAGNOSIS — N3000 Acute cystitis without hematuria: Secondary | ICD-10-CM

## 2019-03-29 DIAGNOSIS — R109 Unspecified abdominal pain: Secondary | ICD-10-CM

## 2019-03-29 DIAGNOSIS — R1031 Right lower quadrant pain: Secondary | ICD-10-CM | POA: Diagnosis present

## 2019-03-29 LAB — COMPREHENSIVE METABOLIC PANEL
ALT: 11 U/L (ref 0–44)
AST: 18 U/L (ref 15–41)
Albumin: 3.4 g/dL — ABNORMAL LOW (ref 3.5–5.0)
Alkaline Phosphatase: 61 U/L (ref 38–126)
Anion gap: 9 (ref 5–15)
BUN: 5 mg/dL — ABNORMAL LOW (ref 6–20)
CO2: 25 mmol/L (ref 22–32)
Calcium: 9.2 mg/dL (ref 8.9–10.3)
Chloride: 103 mmol/L (ref 98–111)
Creatinine, Ser: 0.96 mg/dL (ref 0.44–1.00)
GFR calc Af Amer: >60 mL/min (ref >60)
GFR calc non Af Amer: >60 mL/min (ref >60)
Glucose, Bld: 101 mg/dL — ABNORMAL HIGH (ref 70–99)
Potassium: 3.9 mmol/L (ref 3.5–5.1)
Sodium: 137 mmol/L (ref 135–145)
Total Bilirubin: 0.6 mg/dL (ref 0.3–1.2)
Total Protein: 7.3 g/dL (ref 6.5–8.1)

## 2019-03-29 LAB — URINALYSIS, ROUTINE W REFLEX MICROSCOPIC
Bilirubin Urine: NEGATIVE
Glucose, UA: NEGATIVE mg/dL
Hgb urine dipstick: NEGATIVE
Ketones, ur: NEGATIVE mg/dL
Nitrite: NEGATIVE
Protein, ur: NEGATIVE mg/dL
Specific Gravity, Urine: 1.014 (ref 1.005–1.030)
WBC, UA: >50 WBC/hpf — ABNORMAL HIGH (ref 0–5)
pH: 6 (ref 5.0–8.0)

## 2019-03-29 LAB — CBC
HCT: 39.5 % (ref 36.0–46.0)
Hemoglobin: 12.6 g/dL (ref 12.0–15.0)
MCH: 25.6 pg — ABNORMAL LOW (ref 26.0–34.0)
MCHC: 31.9 g/dL (ref 30.0–36.0)
MCV: 80.1 fL (ref 80.0–100.0)
Platelets: 348 10*3/uL (ref 150–400)
RBC: 4.93 MIL/uL (ref 3.87–5.11)
RDW: 15.7 % — ABNORMAL HIGH (ref 11.5–15.5)
WBC: 8.8 10*3/uL (ref 4.0–10.5)
nRBC: 0 % (ref 0.0–0.2)

## 2019-03-29 LAB — I-STAT BETA HCG BLOOD, ED (MC, WL, AP ONLY): I-stat hCG, quantitative: <5 m[IU]/mL (ref <5)

## 2019-03-29 LAB — LIPASE, BLOOD: Lipase: 26 U/L (ref 11–51)

## 2019-03-29 MED ORDER — SODIUM CHLORIDE 0.9 % IV SOLN
1.0000 g | Freq: Once | INTRAVENOUS | Status: AC
  Administered 2019-03-29: 1 g via INTRAVENOUS
  Filled 2019-03-29: qty 10

## 2019-03-29 MED ORDER — MORPHINE SULFATE (PF) 4 MG/ML IV SOLN
4.0000 mg | Freq: Once | INTRAVENOUS | Status: AC
  Administered 2019-03-29: 4 mg via INTRAVENOUS
  Filled 2019-03-29: qty 1

## 2019-03-29 MED ORDER — ONDANSETRON HCL 4 MG/2ML IJ SOLN
4.0000 mg | Freq: Once | INTRAMUSCULAR | Status: AC
  Administered 2019-03-29: 4 mg via INTRAVENOUS
  Filled 2019-03-29: qty 2

## 2019-03-29 MED ORDER — ALBUTEROL SULFATE HFA 108 (90 BASE) MCG/ACT IN AERS
2.0000 | INHALATION_SPRAY | Freq: Once | RESPIRATORY_TRACT | Status: AC
  Administered 2019-03-29: 2 via RESPIRATORY_TRACT
  Filled 2019-03-29: qty 6.7

## 2019-03-29 MED ORDER — SODIUM CHLORIDE 0.9% FLUSH: 3.0000 mL | Freq: Once | INTRAVENOUS | Status: DC

## 2019-03-29 MED ORDER — KETOROLAC TROMETHAMINE 30 MG/ML IJ SOLN
30.0000 mg | Freq: Once | INTRAMUSCULAR | Status: AC
  Administered 2019-03-29: 30 mg via INTRAVENOUS
  Filled 2019-03-29: qty 1

## 2019-03-29 MED ORDER — SULFAMETHOXAZOLE-TRIMETHOPRIM 800-160 MG PO TABS: 1.0000 | ORAL_TABLET | Freq: Two times a day (BID) | ORAL | 0 refills | Status: AC

## 2019-03-29 NOTE — ED Triage Notes (Signed)
Pt here from home with c/o right flanlk pain that started last night along with nausea , pt has frequency of urine but no burning,  discharge or abnormal bleeding pt does have a history of cyst

## 2019-03-29 NOTE — Discharge Instructions (Signed)

## 2019-03-29 NOTE — ED Provider Notes (Signed)
Emergency Department Provider Note   I have reviewed the triage vital signs and the nursing notes.   HISTORY  Chief Complaint No chief complaint on file.   HPI Alyssa Schneider is a 28 y.o. female presents to the ED with right flank pain which began last night. Patient with nausea and urine frequency but no dysuria. No hesitancy. Denies fever or shaking chills. Pain is severe and intermittent. Radiation of pain to the RLQ. No vaginal bleeding or discharge. No diarrhea. No similar pain in the past. No modifying factors.    History reviewed. No pertinent past medical history.  There are no active problems to display for this patient.   Past Surgical History:  Procedure Laterality Date  . CESAREAN SECTION      Allergies Patient has no known allergies.  No family history on file.  Social History Social History   Tobacco Use  . Smoking status: Never Smoker  . Smokeless tobacco: Never Used  Substance Use Topics  . Alcohol use: Yes    Comment: social   . Drug use: Never    Review of Systems  Constitutional: No fever/chills Eyes: No visual changes. ENT: No sore throat. Cardiovascular: Denies chest pain. Respiratory: Denies shortness of breath. Gastrointestinal: Positive RLQ abdominal pain and right flank pain. Positive nausea, no vomiting.  No diarrhea.  No constipation. Genitourinary: Negative for dysuria. Musculoskeletal: Negative for back pain. Skin: Negative for rash. Neurological: Negative for headaches, focal weakness or numbness.  10-point ROS otherwise negative.  ____________________________________________   PHYSICAL EXAM:  VITAL SIGNS: ED Triage Vitals  Enc Vitals Group     BP 03/29/19 0928 98/68     Pulse Rate 03/29/19 0928 94     Resp 03/29/19 0928 18     Temp 03/29/19 0928 99.3 F (37.4 C)     Temp Source 03/29/19 0928 Oral     SpO2 03/29/19 0928 98 %   Constitutional: Alert and oriented. Well appearing and in no acute distress but does  look somewhat uncomfortable.  Eyes: Conjunctivae are normal.  Head: Atraumatic. Nose: No congestion/rhinnorhea. Mouth/Throat: Mucous membranes are moist.  Neck: No stridor.  Cardiovascular: Normal rate, regular rhythm. Good peripheral circulation. Grossly normal heart sounds.   Respiratory: Normal respiratory effort.  No retractions. Lungs CTAB. Gastrointestinal: Soft with moderate RLQ and RUQ tenderness. No rebound or guarding. No Murphy's sign. No distention. No CVA tenderness.  Musculoskeletal: No lower extremity tenderness nor edema. No gross deformities of extremities. Neurologic:  Normal speech and language. No gross focal neurologic deficits are appreciated.  Skin:  Skin is warm, dry and intact. No rash noted.  ____________________________________________   LABS (all labs ordered are listed, but only abnormal results are displayed)  Labs Reviewed  COMPREHENSIVE METABOLIC PANEL - Abnormal; Notable for the following components:      Result Value   Glucose, Bld 101 (*)    BUN 5 (*)    Albumin 3.4 (*)    All other components within normal limits  CBC - Abnormal; Notable for the following components:   MCH 25.6 (*)    RDW 15.7 (*)    All other components within normal limits  URINALYSIS, ROUTINE W REFLEX MICROSCOPIC - Abnormal; Notable for the following components:   APPearance HAZY (*)    Leukocytes,Ua LARGE (*)    WBC, UA >50 (*)    Bacteria, UA RARE (*)    All other components within normal limits  URINE CULTURE  LIPASE, BLOOD  I-STAT BETA HCG BLOOD,  ED ([facility], [facility], [facility] ONLY)   ____________________________________________  RADIOLOGY  US Transvaginal Non-ob  Result Date: 03/29/2019 CLINICAL DATA:  Right lower quadrant abdominal pain beginning last night. EXAM: TRANSABDOMINAL AND TRANSVAGINAL ULTRASOUND OF PELVIS DOPPLER ULTRASOUND OF OVARIES TECHNIQUE: Both transabdominal and transvaginal ultrasound examinations of the pelvis were performed. Transabdominal technique was  performed for global imaging of the pelvis including uterus, ovaries, adnexal regions, and pelvic cul-de-sac. It was necessary to proceed with endovaginal exam following the transabdominal exam to visualize the adnexa. Color and duplex Doppler ultrasound was utilized to evaluate blood flow to the ovaries. COMPARISON:  CT of the abdomen and pelvis without contrast 03/29/2019. FINDINGS: Uterus Measurements: 10.8 x 4.1 x 4.1 cm = volume: 95.8 mL. A posterior fundal fibroid measures 1.2 x 1.0 x 1.0 cm. No other focal lesions are present Endometrium Thickness: 5 mm, within normal limits. No focal abnormality visualized. Right ovary Measurements: 3.1 x 1.7 x 2.5 cm = volume: 6.7 mL. Normal appearance/no adnexal mass. Left ovary Measurements: 0.3 x 2.1 x 1.8 cm = volume: 4.6 mL. Normal appearance/no adnexal mass. Pulsed Doppler evaluation of both ovaries demonstrates normal low-resistance arterial and venous waveforms. Other findings No abnormal free fluid. IMPRESSION: 1. Normal ultrasound of the pelvis. Uterus and ovaries are within normal limits. 2. Normal pulsed Doppler evaluation of both ovaries without evidence for torsion or other vascular abnormality. Electronically Signed   By: San Morelle M.D.   On: 03/29/2019 12:37   US Pelvis Complete  Result Date: 03/29/2019 CLINICAL DATA:  Right lower quadrant abdominal pain beginning last night. EXAM: TRANSABDOMINAL AND TRANSVAGINAL ULTRASOUND OF PELVIS DOPPLER ULTRASOUND OF OVARIES TECHNIQUE: Both transabdominal and transvaginal ultrasound examinations of the pelvis were performed. Transabdominal technique was performed for global imaging of the pelvis including uterus, ovaries, adnexal regions, and pelvic cul-de-sac. It was necessary to proceed with endovaginal exam following the transabdominal exam to visualize the adnexa. Color and duplex Doppler ultrasound was utilized to evaluate blood flow to the ovaries. COMPARISON:  CT of the abdomen and pelvis without  contrast 03/29/2019. FINDINGS: Uterus Measurements: 10.8 x 4.1 x 4.1 cm = volume: 95.8 mL. A posterior fundal fibroid measures 1.2 x 1.0 x 1.0 cm. No other focal lesions are present Endometrium Thickness: 5 mm, within normal limits. No focal abnormality visualized. Right ovary Measurements: 3.1 x 1.7 x 2.5 cm = volume: 6.7 mL. Normal appearance/no adnexal mass. Left ovary Measurements: 0.3 x 2.1 x 1.8 cm = volume: 4.6 mL. Normal appearance/no adnexal mass. Pulsed Doppler evaluation of both ovaries demonstrates normal low-resistance arterial and venous waveforms. Other findings No abnormal free fluid. IMPRESSION: 1. Normal ultrasound of the pelvis. Uterus and ovaries are within normal limits. 2. Normal pulsed Doppler evaluation of both ovaries without evidence for torsion or other vascular abnormality. Electronically Signed   By: San Morelle M.D.   On: 03/29/2019 12:37   [place] Art/ven Flow Abd Pelv Doppler  Result Date: 03/29/2019 CLINICAL DATA:  Right lower quadrant abdominal pain beginning last night. EXAM: TRANSABDOMINAL AND TRANSVAGINAL ULTRASOUND OF PELVIS DOPPLER ULTRASOUND OF OVARIES TECHNIQUE: Both transabdominal and transvaginal ultrasound examinations of the pelvis were performed. Transabdominal technique was performed for global imaging of the pelvis including uterus, ovaries, adnexal regions, and pelvic cul-de-sac. It was necessary to proceed with endovaginal exam following the transabdominal exam to visualize the adnexa. Color and duplex Doppler ultrasound was utilized to evaluate blood flow to the ovaries. COMPARISON:  CT of the abdomen and pelvis without contrast 03/29/2019. FINDINGS: Uterus Measurements: 10.8  x 4.1 x 4.1 cm = volume: 95.8 mL. A posterior fundal fibroid measures 1.2 x 1.0 x 1.0 cm. No other focal lesions are present Endometrium Thickness: 5 mm, within normal limits. No focal abnormality visualized. Right ovary Measurements: 3.1 x 1.7 x 2.5 cm = volume: 6.7 mL. Normal  appearance/no adnexal mass. Left ovary Measurements: 0.3 x 2.1 x 1.8 cm = volume: 4.6 mL. Normal appearance/no adnexal mass. Pulsed Doppler evaluation of both ovaries demonstrates normal low-resistance arterial and venous waveforms. Other findings No abnormal free fluid. IMPRESSION: 1. Normal ultrasound of the pelvis. Uterus and ovaries are within normal limits. 2. Normal pulsed Doppler evaluation of both ovaries without evidence for torsion or other vascular abnormality. Electronically Signed   By: Marin Robertshristopher  Mattern M.D.   On: 03/29/2019 12:37   Ct Renal Stone Study  Result Date: 03/29/2019 CLINICAL DATA:  Flank pain, stone disease suspected. EXAM: CT ABDOMEN AND PELVIS WITHOUT CONTRAST TECHNIQUE: Multidetector CT imaging of the abdomen and pelvis was performed following the standard protocol without IV contrast. COMPARISON:  No pertinent prior studies available for comparison. FINDINGS: Lower chest: Minimal linear atelectasis within the imaged lung bases.The imaged heart is normal in size. Hepatobiliary: No evidence of focal liver lesion.No biliary ductal dilatation. Pancreas: No evidence of focal lesion.No ductal dilatation or peripancreatic edema. Spleen: No evidence of focal lesion.No splenomegaly. Adrenals/Urinary Tract: No adrenal gland mass. No perinephric stranding. No hydronephrosis or stone.The bladder is underdistended. Stomach/Bowel: The stomach is unremarkable.No dilated loops of bowel or bowel wall thickening.Unremarkable appendix. Vascular/Lymphatic: No abdominal aortic aneurysm.No enlarged lymph nodes. Reproductive: Incompletely assessed by CT modality.No pathologic findings. Other: No ascites. The body wall is unremarkable. Musculoskeletal: No acute bony abnormality. IMPRESSION: No evidence of acute intra-abdominal pathology. No urinary tract calculus is demonstrated. Electronically Signed   By: Jackey LogeKyle  Golden   On: 03/29/2019 11:00    ____________________________________________    PROCEDURES  Procedure(s) performed:   Procedures  None ____________________________________________   INITIAL IMPRESSION / ASSESSMENT AND PLAN / ED COURSE  Pertinent labs & imaging results that were available during my care of the patient were reviewed by me and considered in my medical decision making (see chart for details).   Patient presents to the ED with RLQ and flank pain. Pregnancy negative. No GU symptoms. Initially kidney stone was elevated on the differential but CT renal was unremarkable. Pain improved. TVUS obtained and labs reviewed. No torsion. Possible UTI on UA. Question early pyelonephritis with flank pain but no clear stranding or obvious abscess on CT. Covered with Rocephin and with do 2 weeks of bactrim given pain location. Discussed ED return precautions and PCP follow up plan in the coming week.    ____________________________________________  FINAL CLINICAL IMPRESSION(S) / ED DIAGNOSES  Final diagnoses:  Right flank pain  Acute cystitis without hematuria     MEDICATIONS GIVEN DURING THIS VISIT:  Medications  morphine 4 MG/ML injection 4 mg (4 mg Intravenous Given 03/29/19 1026)  ondansetron (ZOFRAN) injection 4 mg (4 mg Intravenous Given 03/29/19 1024)  albuterol (VENTOLIN HFA) 108 (90 Base) MCG/ACT inhaler 2 puff (2 puffs Inhalation Given 03/29/19 1013)  ketorolac (TORADOL) 30 MG/ML injection 30 mg (30 mg Intravenous Given 03/29/19 1123)  cefTRIAXone (ROCEPHIN) 1 g in sodium chloride 0.9 % 100 mL IVPB (0 g Intravenous Stopped 03/29/19 1436)     NEW OUTPATIENT MEDICATIONS STARTED DURING THIS VISIT:  Discharge Medication List as of 03/29/2019  2:13 PM    START taking these medications   Details  sulfamethoxazole-trimethoprim (BACTRIM DS) 800-160 MG tablet Take 1 tablet by mouth 2 (two) times daily for 10 days., Starting Tue 03/29/2019, Until Fri 04/08/2019, Print        Note:  This document was prepared using Dragon voice recognition software and may include  unintentional dictation errors.  Alona BeneJoshua Long, MD Emergency Medicine    Long, Arlyss RepressJoshua G, MD 03/29/19 [phone]

## 2019-03-29 NOTE — ED Notes (Signed)
ED Provider at bedside. 

## 2019-03-29 NOTE — ED Notes (Signed)
Patient transported to Ultrasound 

## 2019-03-31 LAB — URINE CULTURE
Culture: >=100000 — AB
Special Requests: NORMAL

## 2019-04-01 ENCOUNTER — Telehealth: Payer: Self-pay | Admitting: *Deleted

## 2019-04-01 NOTE — Progress Notes (Signed)
ED Antimicrobial Stewardship Positive Culture Follow Up   Alyssa Schneider is an 28 y.o. female who presented to [facility] on 03/29/2019 with a chief complaint of right sided flank pain.   Recent Results (from the past 720 hour(s))  Urine culture     Status: Abnormal   Collection Time: 03/29/19 10:14 AM   Specimen: Urine, Clean Catch  Result Value Ref Range Status   Specimen Description URINE, CLEAN CATCH  Final   Special Requests   Final    Normal Performed at [facility], [address], [place], [place] [address]    Culture >=100,000 COLONIES/mL ESCHERICHIA COLI (A)  Final   Report Status 03/31/2019 FINAL  Final   Organism ID, Bacteria ESCHERICHIA COLI (A)  Final      Susceptibility   Escherichia coli - MIC*    AMPICILLIN >=32 RESISTANT Resistant     CEFAZOLIN <=4 SENSITIVE Sensitive     CEFTRIAXONE <=1 SENSITIVE Sensitive     CIPROFLOXACIN <=0.25 SENSITIVE Sensitive     GENTAMICIN >=16 RESISTANT Resistant     IMIPENEM <=0.25 SENSITIVE Sensitive     NITROFURANTOIN <=16 SENSITIVE Sensitive     TRIMETH/SULFA >=320 RESISTANT Resistant     AMPICILLIN/SULBACTAM 16 INTERMEDIATE Intermediate     PIP/TAZO <=4 SENSITIVE Sensitive     Extended ESBL NEGATIVE Sensitive     * >=100,000 COLONIES/mL ESCHERICHIA COLI    [x]  Treated with Bactrim, organism resistant to prescribed antimicrobial  New antibiotic prescription: start cephalexin 500 mg four times daily for 10 days. Stop Bactrim that was prescribed.   ED Provider: Eliezer Mccoy, PA-C   Thank you,   Eddie Candle, PharmD PGY-1 Pharmacy Resident  Monday - Friday phone -  [phone] Saturday - Sunday phone - [phone]

## 2019-04-01 NOTE — Telephone Encounter (Signed)
Post ED Visit - Positive Culture Follow-up: Successful Patient Follow-Up  Culture assessed and recommendations reviewed by:  []  Elenor Quinones, Pharm.D. []  Heide Guile, Pharm.D., BCPS AQ-ID []  Parks Neptune, Pharm.D., BCPS []  Alycia Rossetti, Pharm.D., BCPS []  [place], [place].D., BCPS, AAHIVP []  Legrand Como, Pharm.D., BCPS, AAHIVP []  Salome Arnt, PharmD, BCPS []  Johnnette Gourd, PharmD, BCPS []  Hughes Better, PharmD, BCPS []  Leeroy Cha, PharmD Eddie Candle, PharmD  Positive urine culture  []  Patient discharged without antimicrobial prescription and treatment is now indicated [x]  Organism is resistant to prescribed ED discharge antimicrobial []  Patient with positive blood cultures  Changes discussed with ED provider Eliezer Mccoy, PA-C New antibiotic prescription Cephalexin 500mg  5 times daily for 10 days, Stop Bactrim Called to [facility] [phone]  Contacted patient, date 04/01/2019, time [place], [place] 04/01/2019, 9:39 AM

## 2019-06-29 ENCOUNTER — Ambulatory Visit: Attending: Physician Assistant

## 2019-06-29 DIAGNOSIS — Z3009 Encounter for other general counseling and advice on contraception: Secondary | ICD-10-CM

## 2019-06-29 DIAGNOSIS — Z6837 Body mass index (BMI) 37.0-37.9, adult: Secondary | ICD-10-CM

## 2019-06-29 DIAGNOSIS — Z30013 Encounter for initial prescription of injectable contraceptive: Secondary | ICD-10-CM

## 2019-06-29 DIAGNOSIS — Z7689 Persons encountering health services in other specified circumstances: Principal | ICD-10-CM

## 2019-06-29 DIAGNOSIS — R197 Diarrhea, unspecified: Secondary | ICD-10-CM

## 2019-06-29 DIAGNOSIS — R1084 Generalized abdominal pain: Secondary | ICD-10-CM

## 2019-06-29 DIAGNOSIS — J45909 Unspecified asthma, uncomplicated: Principal | ICD-10-CM

## 2019-06-29 DIAGNOSIS — Z206 Contact with and (suspected) exposure to human immunodeficiency virus [HIV]: Secondary | ICD-10-CM

## 2019-06-29 DIAGNOSIS — R110 Nausea: Secondary | ICD-10-CM

## 2019-06-29 DIAGNOSIS — O98219 Gonorrhea complicating pregnancy, unspecified trimester: Secondary | ICD-10-CM

## 2019-06-29 MED ORDER — ONDANSETRON 4 MG PO TBDP
4 mg | Freq: Two times a day (BID) | ORAL | 0 refills | 10.00 days | Status: CP | PRN
Start: 2019-06-29 — End: ?

## 2019-06-29 MED ORDER — DICYCLOMINE HCL 10 MG PO CAPS: Start: 2019-06-29 — End: 2019-06-29

## 2019-06-29 MED ORDER — PROAIR HFA 108 (90 BASE) MCG/ACT IN AERS: Start: 2019-06-29 — End: 2019-06-29

## 2019-06-29 MED ORDER — LOPERAMIDE HCL 2 MG PO CAPS
ORAL | 0 refills | Status: CP
Start: 2019-06-29 — End: ?

## 2019-06-29 MED ORDER — MEDROXYPROGESTERONE ACETATE 150 MG/ML IM SUSY
150 mg | Freq: Once | INTRAMUSCULAR | 3 refills | 1.00 days | Status: CP
Start: 2019-06-29 — End: ?

## 2019-08-03 ENCOUNTER — Encounter: Payer: Medicaid Other | Attending: Physician Assistant

## 2019-09-14 ENCOUNTER — Ambulatory Visit: Payer: Medicaid Other | Attending: Nurse Practitioner

## 2019-09-14 DIAGNOSIS — Z6839 Body mass index (BMI) 39.0-39.9, adult: Secondary | ICD-10-CM

## 2019-09-14 DIAGNOSIS — O98219 Gonorrhea complicating pregnancy, unspecified trimester: Secondary | ICD-10-CM

## 2019-09-14 DIAGNOSIS — Z206 Contact with and (suspected) exposure to human immunodeficiency virus [HIV]: Secondary | ICD-10-CM

## 2019-09-14 DIAGNOSIS — Z124 Encounter for screening for malignant neoplasm of cervix: Secondary | ICD-10-CM

## 2019-09-14 DIAGNOSIS — J45909 Unspecified asthma, uncomplicated: Principal | ICD-10-CM

## 2019-09-14 DIAGNOSIS — Z3009 Encounter for other general counseling and advice on contraception: Secondary | ICD-10-CM

## 2019-09-14 DIAGNOSIS — Z113 Encounter for screening for infections with a predominantly sexual mode of transmission: Secondary | ICD-10-CM

## 2019-09-14 DIAGNOSIS — Z0000 Encounter for general adult medical examination without abnormal findings: Principal | ICD-10-CM

## 2019-09-14 DIAGNOSIS — N926 Irregular menstruation, unspecified: Secondary | ICD-10-CM

## 2019-09-14 MED ORDER — MEDROXYPROGESTERONE ACETATE 150 MG/ML IM SUSP
150 mg | Freq: Once | INTRAMUSCULAR | 3 refills | Status: CP
Start: 2019-09-14 — End: ?

## 2019-09-27 ENCOUNTER — Encounter: Payer: Medicaid Other | Attending: Nurse Practitioner

## 2019-10-05 ENCOUNTER — Encounter: Payer: Medicaid Other | Attending: Nurse Practitioner

## 2019-12-16 ENCOUNTER — Ambulatory Visit: Admit: 2019-12-16 | Discharge: 2019-12-16 | Attending: Family Medicine

## 2020-01-09 ENCOUNTER — Ambulatory Visit: Admit: 2020-01-09 | Discharge: 2020-01-09

## 2020-03-07 ENCOUNTER — Ambulatory Visit: Payer: Medicaid Other | Attending: Physician Assistant | Primary: Physician Assistant

## 2020-03-07 DIAGNOSIS — Z3042 Encounter for surveillance of injectable contraceptive: Principal | ICD-10-CM

## 2020-03-07 DIAGNOSIS — N943 Premenstrual tension syndrome: Secondary | ICD-10-CM

## 2020-03-07 DIAGNOSIS — Z6839 Body mass index (BMI) 39.0-39.9, adult: Secondary | ICD-10-CM

## 2020-03-07 DIAGNOSIS — O98219 Gonorrhea complicating pregnancy, unspecified trimester: Secondary | ICD-10-CM

## 2020-03-07 DIAGNOSIS — N926 Irregular menstruation, unspecified: Secondary | ICD-10-CM

## 2020-03-07 DIAGNOSIS — Z206 Contact with and (suspected) exposure to human immunodeficiency virus [HIV]: Secondary | ICD-10-CM

## 2020-03-07 DIAGNOSIS — J45909 Unspecified asthma, uncomplicated: Principal | ICD-10-CM

## 2020-03-07 MED ORDER — MEDROXYPROGESTERONE ACETATE 150 MG/ML IM SUSY
150 mg | Freq: Once | INTRAMUSCULAR | Status: CP
Start: 2020-03-07 — End: ?

## 2020-07-11 ENCOUNTER — Encounter: Payer: Medicaid Other | Primary: Physician Assistant

## 2020-07-27 IMAGING — CT CT RENAL STONE PROTOCOL
2 of 4 series · 17 of 46 positions shown, 19 images · non-contrast
Comparison: No pertinent prior studies available for comparison.

CLINICAL DATA: Flank pain, stone disease suspected.

EXAM:
CT ABDOMEN AND PELVIS WITHOUT CONTRAST
TECHNIQUE: Multidetector CT imaging of the abdomen and pelvis was performed
following the standard protocol without IV contrast.

[Series 3: ap without · axial · non-contrast · 0.76mm/px · z∈[+742,+1166]mm · 14 of 95 slices shown, 16 images]
[im 5/95  soft-tissue]
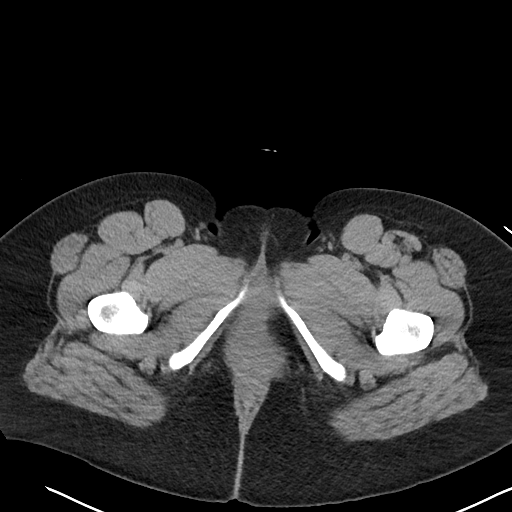
[im 5/95  bone]
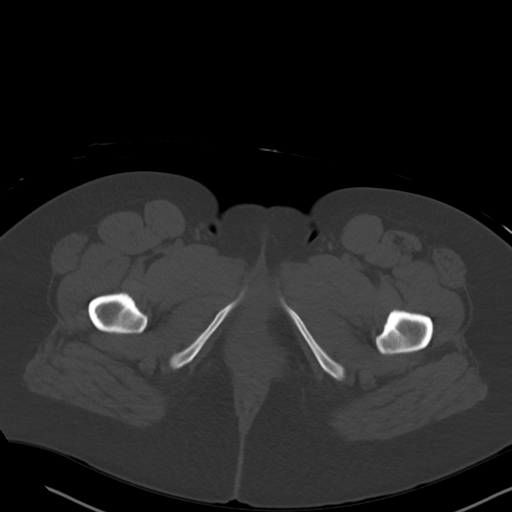
[im 15/95  soft-tissue]
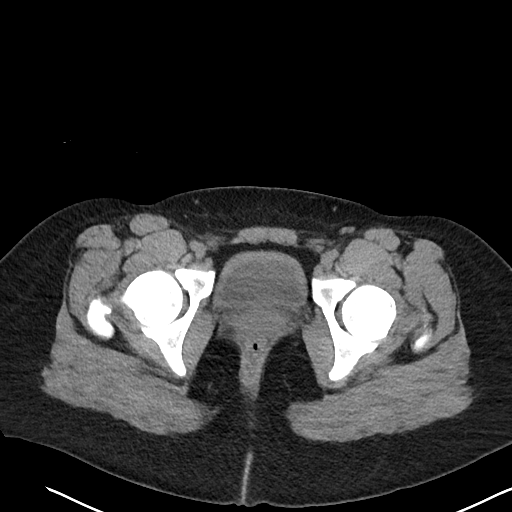
[im 19/95  soft-tissue]
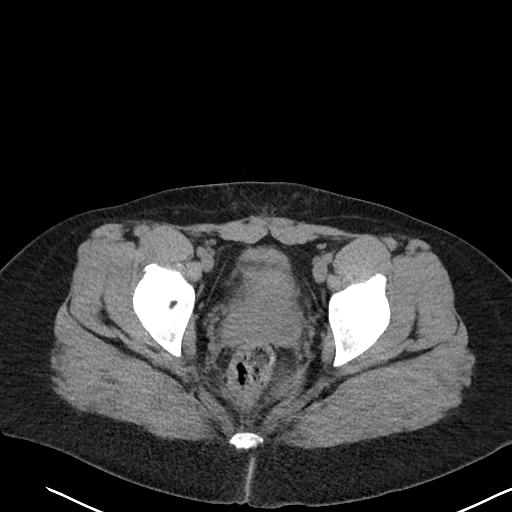
[im 24/95  soft-tissue]
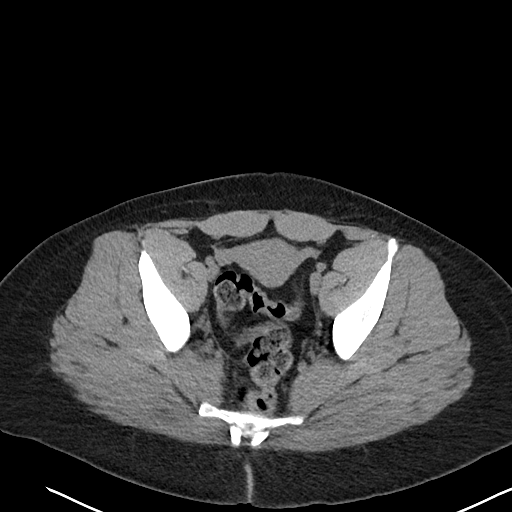
[im 33/95  soft-tissue]
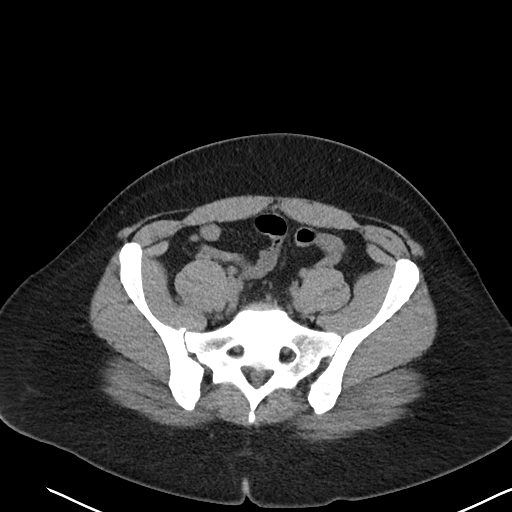
[im 38/95  soft-tissue]
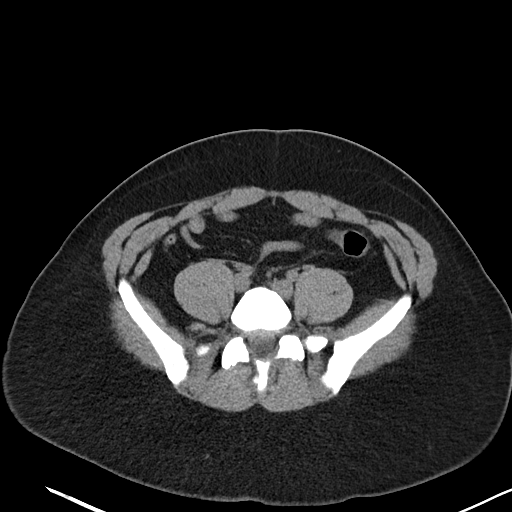
[im 43/95  soft-tissue]
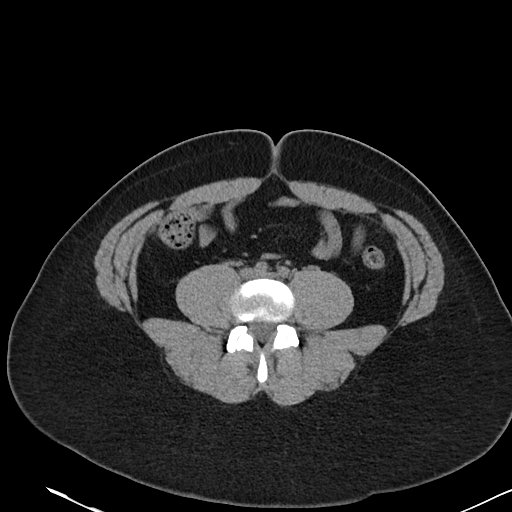
[im 52/95  soft-tissue]
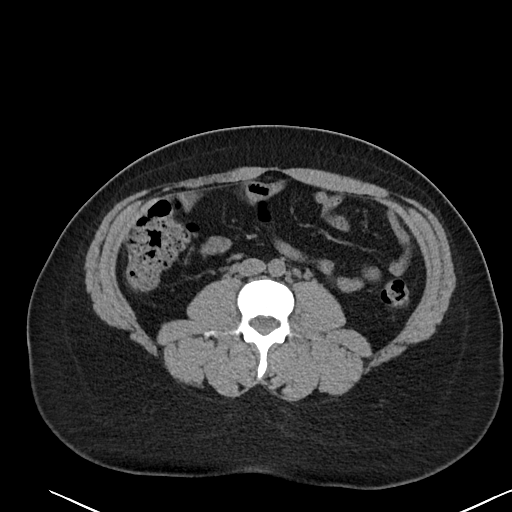
[im 57/95  soft-tissue]
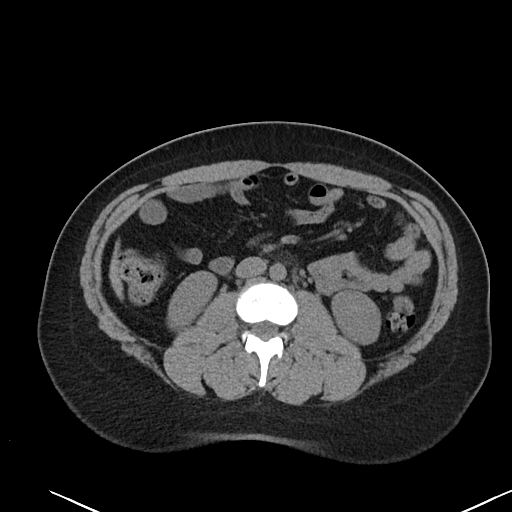
[im 57/95  bone]
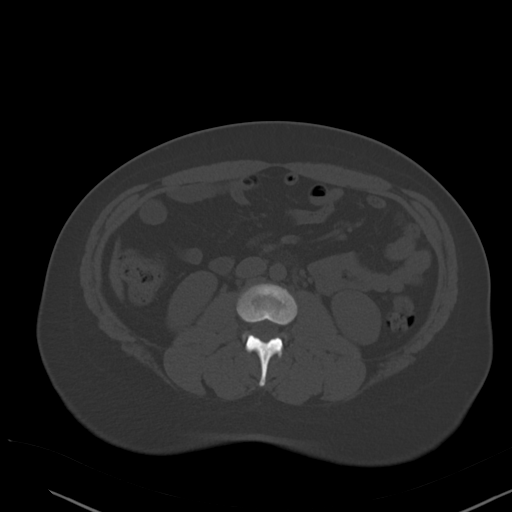
[im 62/95  soft-tissue]
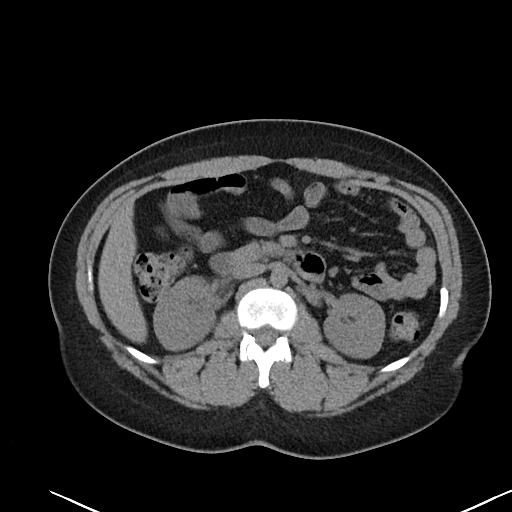
[im 71/95  soft-tissue]
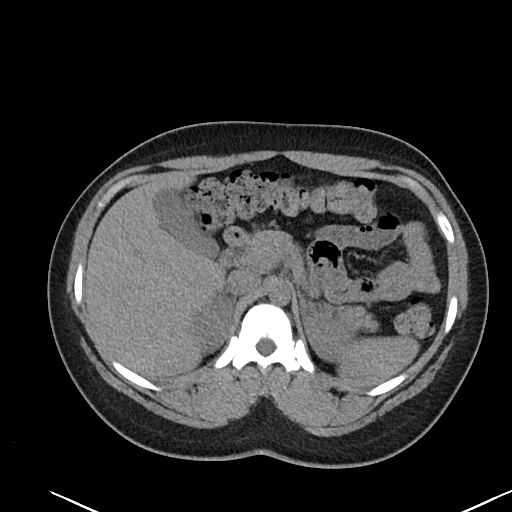
[im 76/95  soft-tissue]
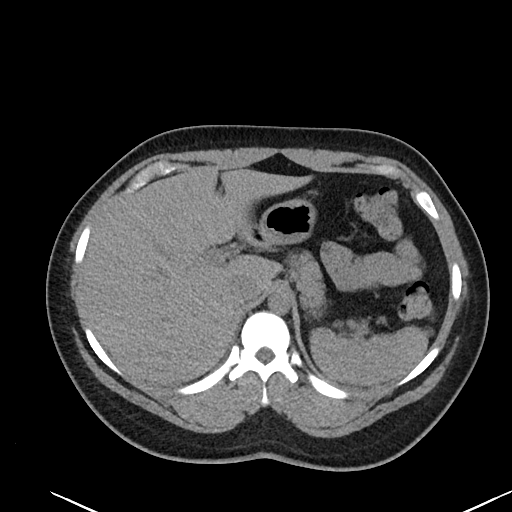
[im 80/95  soft-tissue]
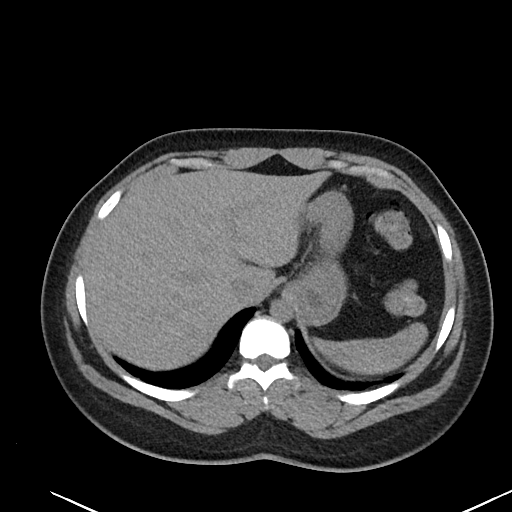
[im 90/95  soft-tissue]
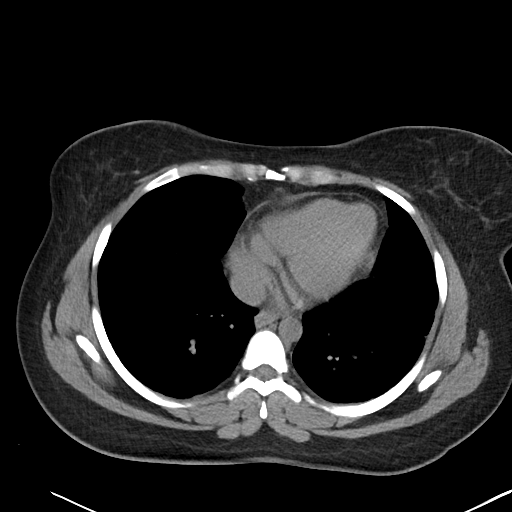

[Series 6: cor · coronal · 0.93mm/px · 3 of 101 slices shown]
[im 34/101  soft-tissue]
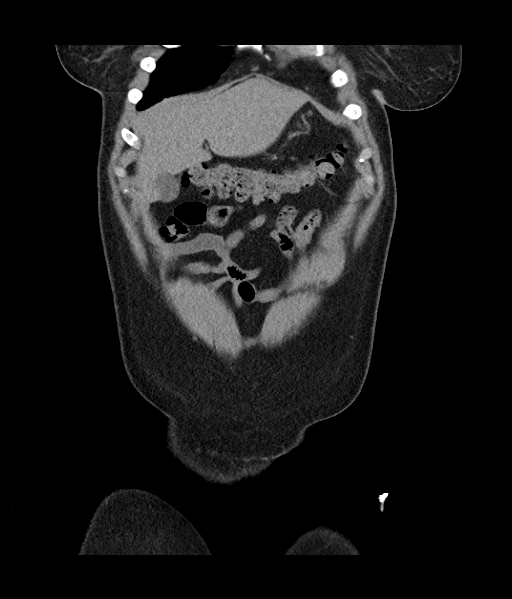
[im 45/101  soft-tissue]
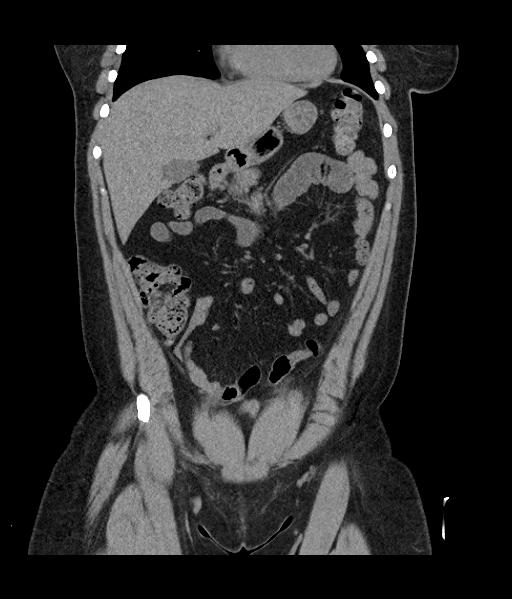
[im 56/101  soft-tissue]
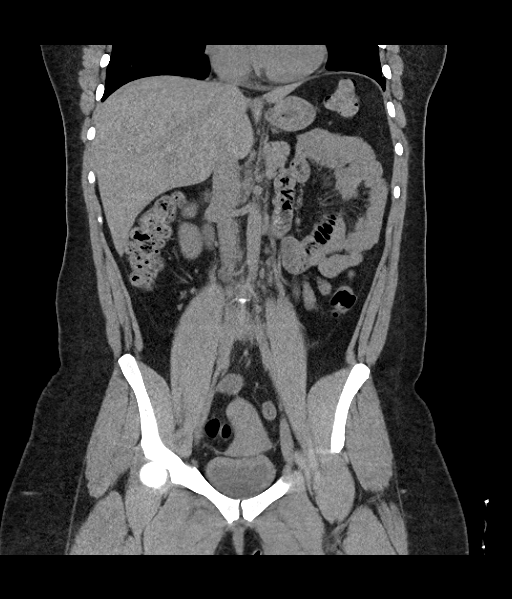

[17 of 46 positions shown; findings below may reference images not displayed]

FINDINGS: Lower chest: Minimal linear atelectasis within the imaged lung
bases.The imaged heart is normal in size.

Hepatobiliary: No evidence of focal liver lesion.No biliary ductal
dilatation.

Pancreas: No evidence of focal lesion.No ductal dilatation or
peripancreatic edema.

Spleen: No evidence of focal lesion.No splenomegaly.

Adrenals/Urinary Tract: No adrenal gland mass. No perinephric
stranding. No hydronephrosis or stone.The bladder is underdistended.

Stomach/Bowel: The stomach is unremarkable.No dilated loops of bowel
or bowel wall thickening.Unremarkable appendix.

Vascular/Lymphatic: No abdominal aortic aneurysm.No enlarged lymph
nodes.

Reproductive: Incompletely assessed by CT modality.No pathologic
findings.

Other: No ascites. The body wall is unremarkable.

Musculoskeletal: No acute bony abnormality.
IMPRESSION: No evidence of acute intra-abdominal pathology. No urinary tract
calculus is demonstrated.

## 2020-08-02 ENCOUNTER — Ambulatory Visit: Payer: Medicaid Other | Attending: Family | Primary: Physician Assistant

## 2020-08-02 DIAGNOSIS — Z3009 Encounter for other general counseling and advice on contraception: Secondary | ICD-10-CM

## 2020-08-02 DIAGNOSIS — J45909 Unspecified asthma, uncomplicated: Principal | ICD-10-CM

## 2020-08-02 DIAGNOSIS — Z206 Contact with and (suspected) exposure to human immunodeficiency virus [HIV]: Secondary | ICD-10-CM

## 2020-08-02 DIAGNOSIS — R1902 Left upper quadrant abdominal swelling, mass and lump: Principal | ICD-10-CM

## 2020-08-02 DIAGNOSIS — Z6838 Body mass index (BMI) 38.0-38.9, adult: Secondary | ICD-10-CM

## 2020-08-02 DIAGNOSIS — N912 Amenorrhea, unspecified: Secondary | ICD-10-CM

## 2020-08-02 DIAGNOSIS — O98219 Gonorrhea complicating pregnancy, unspecified trimester: Secondary | ICD-10-CM

## 2020-08-02 MED ORDER — MEDROXYPROGESTERONE ACETATE 150 MG/ML IM SUSP
150 mg | Freq: Once | INTRAMUSCULAR | 3 refills | Status: CP
Start: 2020-08-02 — End: ?

## 2020-08-13 ENCOUNTER — Inpatient Hospital Stay: Primary: Physician Assistant

## 2021-03-14 ENCOUNTER — Encounter: Payer: Medicaid Other | Attending: Physician Assistant | Primary: Physician Assistant

## 2021-03-15 ENCOUNTER — Ambulatory Visit: Payer: Medicaid Other | Primary: Physician Assistant

## 2021-03-15 DIAGNOSIS — Z3009 Encounter for other general counseling and advice on contraception: Principal | ICD-10-CM

## 2021-03-15 DIAGNOSIS — Z206 Contact with and (suspected) exposure to human immunodeficiency virus [HIV]: Secondary | ICD-10-CM

## 2021-03-15 DIAGNOSIS — J45909 Unspecified asthma, uncomplicated: Principal | ICD-10-CM

## 2021-03-15 DIAGNOSIS — Z1329 Encounter for screening for other suspected endocrine disorder: Secondary | ICD-10-CM

## 2021-03-15 DIAGNOSIS — Z13220 Encounter for screening for lipoid disorders: Secondary | ICD-10-CM

## 2021-03-15 DIAGNOSIS — G43009 Migraine without aura, not intractable, without status migrainosus: Secondary | ICD-10-CM

## 2021-03-15 DIAGNOSIS — M7989 Other specified soft tissue disorders: Secondary | ICD-10-CM

## 2021-03-15 DIAGNOSIS — M79674 Pain in right toe(s): Secondary | ICD-10-CM

## 2021-03-15 DIAGNOSIS — L608 Other nail disorders: Secondary | ICD-10-CM

## 2021-03-15 DIAGNOSIS — M79671 Pain in right foot: Secondary | ICD-10-CM

## 2021-03-15 DIAGNOSIS — Z113 Encounter for screening for infections with a predominantly sexual mode of transmission: Secondary | ICD-10-CM

## 2021-03-15 DIAGNOSIS — O98219 Gonorrhea complicating pregnancy, unspecified trimester: Secondary | ICD-10-CM

## 2021-03-15 DIAGNOSIS — E559 Vitamin D deficiency, unspecified: Secondary | ICD-10-CM

## 2021-03-15 MED ORDER — MEDROXYPROGESTERONE ACETATE 150 MG/ML IM SUSP
150 mg | Freq: Once | INTRAMUSCULAR | 3 refills | Status: CP
Start: 2021-03-15 — End: ?

## 2021-03-15 MED ORDER — TERBINAFINE HCL 250 MG PO TABS
250 mg | Freq: Every day | ORAL | 0 refills | Status: CP
Start: 2021-03-15 — End: ?

## 2021-03-15 MED ORDER — BUTALBITAL-APAP-CAFFEINE 50-325-40 MG PO TABS
1 | ORAL_TABLET | ORAL | Status: CN | PRN
Start: 2021-03-15 — End: ?

## 2021-03-15 MED ORDER — BUTALBITAL-APAP-CAFFEINE 50-325-40 MG PO TABS
1 | ORAL_TABLET | Freq: Four times a day (QID) | ORAL | 0 refills | 30.00000 days | Status: CP | PRN
Start: 2021-03-15 — End: ?

## 2022-05-29 ENCOUNTER — Encounter: Payer: Medicaid Other | Attending: Primary Care

## 2022-06-02 ENCOUNTER — Ambulatory Visit: Payer: Medicaid Other | Attending: Primary Care

## 2022-06-02 DIAGNOSIS — Z0000 Encounter for general adult medical examination without abnormal findings: Secondary | ICD-10-CM

## 2022-06-02 DIAGNOSIS — Z206 Contact with and (suspected) exposure to human immunodeficiency virus [HIV]: Secondary | ICD-10-CM

## 2022-06-02 DIAGNOSIS — O98219 Gonorrhea complicating pregnancy, unspecified trimester: Secondary | ICD-10-CM

## 2022-06-02 DIAGNOSIS — R7303 Prediabetes: Secondary | ICD-10-CM

## 2022-06-02 DIAGNOSIS — N898 Other specified noninflammatory disorders of vagina: Secondary | ICD-10-CM

## 2022-06-02 DIAGNOSIS — Z13220 Encounter for screening for lipoid disorders: Secondary | ICD-10-CM

## 2022-06-02 DIAGNOSIS — R197 Diarrhea, unspecified: Principal | ICD-10-CM

## 2022-06-02 DIAGNOSIS — Z124 Encounter for screening for malignant neoplasm of cervix: Secondary | ICD-10-CM

## 2022-06-02 DIAGNOSIS — Z1329 Encounter for screening for other suspected endocrine disorder: Secondary | ICD-10-CM

## 2022-06-02 DIAGNOSIS — J45909 Unspecified asthma, uncomplicated: Principal | ICD-10-CM

## 2022-06-09 ENCOUNTER — Telehealth: Payer: Medicaid Other | Attending: Family | Primary: Family Medicine

## 2022-06-09 DIAGNOSIS — A599 Trichomoniasis, unspecified: Secondary | ICD-10-CM

## 2022-06-09 DIAGNOSIS — B379 Candidiasis, unspecified: Principal | ICD-10-CM

## 2022-06-09 MED ORDER — FLUCONAZOLE 150 MG PO TABS
150 mg | Freq: Once | ORAL | 0 refills | 8.00000 days | Status: CP
Start: 2022-06-09 — End: ?

## 2022-06-09 MED ORDER — METRONIDAZOLE 500 MG PO TABS
500 mg | Freq: Two times a day (BID) | ORAL | 0 refills | Status: CP
Start: 2022-06-09 — End: ?

## 2022-06-19 DIAGNOSIS — A599 Trichomoniasis, unspecified: Principal | ICD-10-CM

## 2022-06-19 DIAGNOSIS — B379 Candidiasis, unspecified: Secondary | ICD-10-CM

## 2022-06-19 MED ORDER — METRONIDAZOLE 500 MG PO TABS
500 mg | Freq: Two times a day (BID) | ORAL | 0 refills
Start: 2022-06-19 — End: ?

## 2022-06-19 MED ORDER — FLUCONAZOLE 150 MG PO TABS
150 mg | Freq: Once | ORAL | 0 refills
Start: 2022-06-19 — End: ?

## 2022-06-22 ENCOUNTER — Telehealth: Payer: Medicaid Other | Attending: Family | Primary: Family Medicine

## 2022-06-22 DIAGNOSIS — B379 Candidiasis, unspecified: Principal | ICD-10-CM

## 2022-06-22 DIAGNOSIS — A599 Trichomoniasis, unspecified: Secondary | ICD-10-CM

## 2022-06-22 MED ORDER — FLUCONAZOLE 150 MG PO TABS
150 mg | Freq: Once | ORAL | 0 refills | 8.00000 days | Status: CP
Start: 2022-06-22 — End: ?

## 2022-06-22 MED ORDER — METRONIDAZOLE 500 MG PO TABS
500 mg | Freq: Two times a day (BID) | ORAL | 0 refills | Status: CP
Start: 2022-06-22 — End: ?

## 2022-06-30 DIAGNOSIS — J45909 Unspecified asthma, uncomplicated: Principal | ICD-10-CM

## 2022-06-30 DIAGNOSIS — Z206 Contact with and (suspected) exposure to human immunodeficiency virus [HIV]: Secondary | ICD-10-CM

## 2022-06-30 DIAGNOSIS — O98219 Gonorrhea complicating pregnancy, unspecified trimester: Secondary | ICD-10-CM

## 2022-07-15 ENCOUNTER — Encounter: Payer: Medicaid Other | Attending: Primary Care | Primary: Family Medicine

## 2022-09-17 ENCOUNTER — Encounter: Payer: Medicaid Other | Attending: Primary Care | Primary: Family Medicine

## 2022-09-24 ENCOUNTER — Encounter: Payer: Medicaid Other | Attending: Primary Care | Primary: Family Medicine

## 2023-04-13 ENCOUNTER — Encounter: Payer: Medicaid Other | Attending: Primary Care | Primary: Family Medicine

## 2023-08-29 ENCOUNTER — Encounter: Payer: Medicaid Other | Attending: Physician Assistant | Primary: Family Medicine

## 2023-09-16 ENCOUNTER — Encounter: Payer: Medicaid Other | Attending: Primary Care | Primary: Family Medicine

## 2023-09-17 ENCOUNTER — Encounter: Payer: Medicaid Other | Attending: Primary Care | Primary: Family Medicine

## 2023-10-05 ENCOUNTER — Encounter: Payer: Medicaid Other | Attending: Primary Care | Primary: Family Medicine

## 2024-02-09 ENCOUNTER — Encounter: Payer: MEDICAID | Attending: Primary Care | Primary: Family Medicine

## 2024-02-12 ENCOUNTER — Encounter: Payer: MEDICAID | Attending: Primary Care | Primary: Family Medicine

## 2024-02-25 ENCOUNTER — Encounter: Payer: MEDICAID | Attending: Physician Assistant | Primary: Family Medicine

## 2024-03-24 ENCOUNTER — Encounter: Payer: MEDICAID | Attending: Nurse Practitioner | Primary: Family Medicine

## 2024-08-19 ENCOUNTER — Inpatient Hospital Stay: Admit: 2024-08-19 | Discharge: 2024-08-19 | Payer: MEDICAID

## 2024-08-19 DIAGNOSIS — Z206 Contact with and (suspected) exposure to human immunodeficiency virus [HIV]: Secondary | ICD-10-CM

## 2024-08-19 DIAGNOSIS — J45909 Unspecified asthma, uncomplicated: Principal | ICD-10-CM

## 2024-08-19 DIAGNOSIS — M549 Dorsalgia, unspecified: Principal | ICD-10-CM

## 2024-08-19 DIAGNOSIS — O98219 Gonorrhea complicating pregnancy, unspecified trimester: Secondary | ICD-10-CM

## 2024-08-19 MED ORDER — LIDOCAINE 5 % EX PTCH
1 | MEDICATED_PATCH | Freq: Every day | TRANSDERMAL | 0 refills | 30.00000 days | Status: CP
Start: 2024-08-19 — End: ?

## 2024-08-19 MED ORDER — METHOCARBAMOL 500 MG PO TABS
500 mg | Freq: Four times a day (QID) | ORAL | 0 refills | 30.00000 days | Status: CP
Start: 2024-08-19 — End: ?

## 2024-08-19 MED ORDER — LIDOCAINE 5 % EX PTCH
1 | MEDICATED_PATCH | Freq: Every day | TRANSDERMAL | 0 refills | 30.00000 days | Status: CP
Start: 2024-08-19 — End: 2024-08-19

## 2024-09-01 ENCOUNTER — Encounter: Attending: Physician Assistant | Primary: Family Medicine
